# Patient Record
Sex: Male | Born: 1985 | Race: White | Hispanic: No | Marital: Single | Smoking: Current some day smoker
Health system: Southern US, Community
[De-identification: ages and names within clinical notes are randomized; demographics above are authoritative.]

## PROBLEM LIST (undated history)

## (undated) DIAGNOSIS — K200 Eosinophilic esophagitis: Secondary | ICD-10-CM

## (undated) HISTORY — DX: Eosinophilic esophagitis: K20.0

---

## 2010-09-17 ENCOUNTER — Ambulatory Visit: Payer: Self-pay | Admitting: Surgery

## 2013-08-24 ENCOUNTER — Emergency Department: Payer: Self-pay | Admitting: Emergency Medicine

## 2016-05-30 ENCOUNTER — Emergency Department: Admission: EM | Admit: 2016-05-30 | Discharge: 2016-05-30 | Discharge status: Absent without leave | Payer: Self-pay

## 2017-07-11 ENCOUNTER — Encounter: Payer: Self-pay | Admitting: Physician Assistant

## 2017-07-11 ENCOUNTER — Ambulatory Visit: Payer: Self-pay | Admitting: Physician Assistant

## 2017-07-11 VITALS — BP 130/79 | HR 100 | Temp 98.5°F | Resp 16

## 2017-07-11 DIAGNOSIS — H6500 Acute serous otitis media, unspecified ear: Secondary | ICD-10-CM

## 2017-07-11 DIAGNOSIS — J209 Acute bronchitis, unspecified: Secondary | ICD-10-CM

## 2017-07-11 MED ORDER — METHYLPREDNISOLONE 4 MG PO TBPK: ORAL_TABLET | ORAL | 0 refills | Status: DC

## 2017-07-11 MED ORDER — AMOXICILLIN-POT CLAVULANATE 875-125 MG PO TABS: 1.0000 | ORAL_TABLET | Freq: Two times a day (BID) | ORAL | 0 refills | Status: DC

## 2017-07-11 NOTE — Progress Notes (Signed)
S: Patient complains of bilateral ear pain, coughing congestion, throat irritation, denies fever chills, denies chest pain or shortness of breath. States he's had the symptoms for several days. He thought it was just a cold. Now his ears are really hurting. History of multiple ear infections as a child. + smoker,  Dips snuff  O: Vitals are normal, both TMs are red and swollen, throat is irritated, neck is supple, no lymphadenopathy noted, lungs are clear to auscultation with a few scattered wheezes in the lower lung fields bilaterally, heart sounds are normal,  A: Acute bronchitis, acute otitis media  P: Augmentin 875 mg twice a day for 10 days, Medrol Dosepak

## 2018-05-23 ENCOUNTER — Ambulatory Visit: Payer: Self-pay | Admitting: Family Medicine

## 2018-05-23 ENCOUNTER — Other Ambulatory Visit: Payer: Self-pay

## 2018-05-23 ENCOUNTER — Ambulatory Visit (INDEPENDENT_AMBULATORY_CARE_PROVIDER_SITE_OTHER): Payer: Managed Care, Other (non HMO) | Admitting: Family Medicine

## 2018-05-23 ENCOUNTER — Encounter: Payer: Self-pay | Admitting: Family Medicine

## 2018-05-23 VITALS — BP 119/81 | HR 75 | Temp 98.2°F | Ht 71.5 in | Wt 160.0 lb

## 2018-05-23 DIAGNOSIS — M545 Low back pain: Secondary | ICD-10-CM

## 2018-05-23 DIAGNOSIS — J309 Allergic rhinitis, unspecified: Secondary | ICD-10-CM

## 2018-05-23 DIAGNOSIS — Z7689 Persons encountering health services in other specified circumstances: Secondary | ICD-10-CM

## 2018-05-23 DIAGNOSIS — K200 Eosinophilic esophagitis: Secondary | ICD-10-CM

## 2018-05-23 DIAGNOSIS — G8929 Other chronic pain: Secondary | ICD-10-CM

## 2018-05-23 DIAGNOSIS — G4700 Insomnia, unspecified: Secondary | ICD-10-CM

## 2018-05-23 MED ORDER — TRAMADOL HCL 50 MG PO TABS: 50.0000 mg | ORAL_TABLET | Freq: Three times a day (TID) | ORAL | 0 refills | Status: DC | PRN

## 2018-05-23 MED ORDER — MELOXICAM 15 MG PO TABS: 15.0000 mg | ORAL_TABLET | Freq: Every day | ORAL | 0 refills | Status: DC

## 2018-05-23 MED ORDER — CYCLOBENZAPRINE HCL 10 MG PO TABS: 10.0000 mg | ORAL_TABLET | Freq: Three times a day (TID) | ORAL | 1 refills | Status: DC | PRN

## 2018-05-23 NOTE — Progress Notes (Signed)
BP 119/81   Pulse 75   Temp 98.2 F (36.8 C) (Oral)   Ht 5' 11.5" (1.816 m)   Wt 160 lb (72.6 kg)   SpO2 98%   BMI 22.00 kg/m    Subjective:    Patient ID: Joshua Davies, male    DOB: 15-Nov-1985, 32 y.o.   MRN: [id number]  HPI: Joshua Davies is a 32 y.o. male  Chief Complaint  Patient presents with  . New Patient (Initial Visit)    pt states he is having issues with not sleeping well and back pain   Here today to est care.   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 Takes ibuprofen and tylenol daily. Pain radiates across entire back, feels like it locks up on him regularly. Used to go to a Land which worked at first but not after a while. Occasionally down right leg to knee. No incontinence, leg weakness, numbness.   Taking high dose melatonin for sleep, starting to not be effective for him. Feels his back pain is inhibiting his sleep as well.   Takes OTC zyrtec for seasonal allergies with good relief. Taking prilosec OTC for GERD prn.   Relevant past medical, surgical, family and social history reviewed and updated as indicated. Interim medical history since our last visit reviewed. Allergies and medications reviewed and updated.  Review of Systems  Per HPI unless specifically indicated above     Objective:    BP 119/81   Pulse 75   Temp 98.2 F (36.8 C) (Oral)   Ht 5' 11.5" (1.816 m)   Wt 160 lb (72.6 kg)   SpO2 98%   BMI 22.00 kg/m   Wt Readings from Last 3 Encounters:  05/23/18 160 lb (72.6 kg)    Physical Exam  Constitutional: He is oriented to person, place, and time. He appears well-developed and well-nourished. No distress.  HENT:  Head: Atraumatic.  Eyes: Conjunctivae and EOM are normal.  Neck: Normal range of motion. Neck supple.  Cardiovascular: Normal rate, regular rhythm and intact distal pulses.  Pulmonary/Chest: Effort normal and breath sounds normal.  Musculoskeletal: Normal range  of motion. He exhibits no tenderness or deformity.  - SLR  Neurological: He is alert and oriented to person, place, and time. No cranial nerve deficit.  Skin: Skin is warm and dry.  Psychiatric: He has a normal mood and affect. His behavior is normal.  Nursing note and vitals reviewed.   No results found for this or any previous visit.    Assessment & Plan:   Problem List Items Addressed This Visit      Respiratory   Allergic rhinitis    Stable on prn zyrtec, continue current regimen        Digestive   Eosinophilic esophagitis    Stable on prilosec prn        Other   Insomnia    Encouraged good sleep hygiene, unisom prn for inability to sleep. Can continue melatonin       Other Visit Diagnoses    Chronic bilateral low back pain without sciatica    -  Primary   Pt wanting to save workup for next insurance period. Flexeril, meloxicam prn with tramadol small supply for severe pain episodes. Stretches, heating pad   Relevant Medications   cyclobenzaprine (FLEXERIL) 10 MG tablet   traMADol (ULTRAM) 50 MG tablet   meloxicam (MOBIC) 15 MG tablet   Encounter  to establish care           Follow up plan: Return for CPE.

## 2018-05-27 DIAGNOSIS — K200 Eosinophilic esophagitis: Secondary | ICD-10-CM | POA: Insufficient documentation

## 2018-05-27 NOTE — Assessment & Plan Note (Signed)
Encouraged good sleep hygiene, unisom prn for inability to sleep. Can continue melatonin

## 2018-05-27 NOTE — Assessment & Plan Note (Signed)
Stable on prilosec prn

## 2018-05-27 NOTE — Patient Instructions (Signed)
Follow up for CPE 

## 2018-05-27 NOTE — Assessment & Plan Note (Signed)
Stable on prn zyrtec, continue current regimen

## 2018-06-01 ENCOUNTER — Ambulatory Visit (INDEPENDENT_AMBULATORY_CARE_PROVIDER_SITE_OTHER): Payer: Managed Care, Other (non HMO) | Admitting: Family Medicine

## 2018-06-01 ENCOUNTER — Encounter: Payer: Self-pay | Admitting: Family Medicine

## 2018-06-01 VITALS — BP 116/79 | HR 86 | Temp 98.5°F | Ht 71.0 in | Wt 160.3 lb

## 2018-06-01 DIAGNOSIS — Z23 Encounter for immunization: Secondary | ICD-10-CM | POA: Diagnosis not present

## 2018-06-01 NOTE — Patient Instructions (Signed)

## 2018-06-01 NOTE — Progress Notes (Signed)
Pt presented today hoping for CPE, was offered one but ended up declining as he did not want labs drawn today and could not come back for them within 1 week. Will reschedule

## 2018-06-01 NOTE — Progress Notes (Deleted)
   BP 116/79   Pulse 86   Temp 98.5 F (36.9 C) (Oral)   Ht 5\' 11"  (1.803 m)   Wt 160 lb 4.8 oz (72.7 kg)   SpO2 99%   BMI 22.36 kg/m    Subjective:    Patient ID: Joshua Davies, male    DOB: 1986-01-23, 32 y.o.   MRN: [id number]  HPI: Joshua Davies is a 32 y.o. male  Chief Complaint  Patient presents with  . Insomnia    pt states he has been struggling with sleep issues all of his life, states it is mainly trouble going to sleep     Relevant past medical, surgical, family and social history reviewed and updated as indicated. Interim medical history since our last visit reviewed. Allergies and medications reviewed and updated.  Review of Systems  Per HPI unless specifically indicated above     Objective:    BP 116/79   Pulse 86   Temp 98.5 F (36.9 C) (Oral)   Ht 5\' 11"  (1.803 m)   Wt 160 lb 4.8 oz (72.7 kg)   SpO2 99%   BMI 22.36 kg/m   Wt Readings from Last 3 Encounters:  06/01/18 160 lb 4.8 oz (72.7 kg)  05/23/18 160 lb (72.6 kg)    Physical Exam  No results found for this or any previous visit.    Assessment & Plan:   Problem List Items Addressed This Visit    None    Visit Diagnoses    Need for influenza vaccination    -  Primary   Relevant Orders   Flu Vaccine QUAD 36+ mos IM (Completed)       Follow up plan: No follow-ups on file.

## 2018-06-10 ENCOUNTER — Emergency Department
Admission: EM | Admit: 2018-06-10 | Discharge: 2018-06-10 | Disposition: A | Discharge status: Home / Self Care | Payer: No Typology Code available for payment source | Attending: Emergency Medicine | Admitting: Emergency Medicine

## 2018-06-10 ENCOUNTER — Emergency Department: Payer: No Typology Code available for payment source

## 2018-06-10 ENCOUNTER — Other Ambulatory Visit: Payer: Self-pay

## 2018-06-10 DIAGNOSIS — Z79899 Other long term (current) drug therapy: Secondary | ICD-10-CM | POA: Diagnosis not present

## 2018-06-10 DIAGNOSIS — Y990 Civilian activity done for income or pay: Secondary | ICD-10-CM | POA: Diagnosis not present

## 2018-06-10 DIAGNOSIS — Y9389 Activity, other specified: Secondary | ICD-10-CM | POA: Diagnosis not present

## 2018-06-10 DIAGNOSIS — F17220 Nicotine dependence, chewing tobacco, uncomplicated: Secondary | ICD-10-CM | POA: Diagnosis not present

## 2018-06-10 DIAGNOSIS — S63617A Unspecified sprain of left little finger, initial encounter: Secondary | ICD-10-CM

## 2018-06-10 DIAGNOSIS — Y929 Unspecified place or not applicable: Secondary | ICD-10-CM | POA: Diagnosis not present

## 2018-06-10 DIAGNOSIS — S6992XA Unspecified injury of left wrist, hand and finger(s), initial encounter: Secondary | ICD-10-CM | POA: Diagnosis present

## 2018-06-10 DIAGNOSIS — W51XXXA Accidental striking against or bumped into by another person, initial encounter: Secondary | ICD-10-CM | POA: Diagnosis not present

## 2018-06-10 NOTE — ED Notes (Signed)
Pt does  Not need uds per Lt cm martin  Present with the patient
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
## 2018-06-19 ENCOUNTER — Other Ambulatory Visit: Payer: Self-pay | Admitting: Family Medicine

## 2018-06-19 NOTE — Telephone Encounter (Signed)
Please review for refill for Meloxicam 15 mg. No lab ordered for Hgb or Creatinine for this medication, as per protocol. LOV  06/01/18 NOV 06/29/18  Roosvelt Maser, PA [facility] # [phone]  [address] [place]

## 2018-06-20 ENCOUNTER — Other Ambulatory Visit: Payer: Self-pay | Admitting: Family Medicine

## 2018-06-29 ENCOUNTER — Ambulatory Visit: Payer: Managed Care, Other (non HMO) | Admitting: Family Medicine

## 2018-06-29 ENCOUNTER — Encounter: Payer: Managed Care, Other (non HMO) | Admitting: Family Medicine

## 2018-07-13 ENCOUNTER — Other Ambulatory Visit: Payer: Self-pay | Admitting: Family Medicine

## 2018-07-13 NOTE — Telephone Encounter (Signed)
Requested Prescriptions  Pending Prescriptions Disp Refills  . meloxicam (MOBIC) 15 MG tablet [Pharmacy Med Name: MELOXICAM 15 MG TABLET] 30 tablet 0    Sig: TAKE 1 TABLET BY MOUTH EVERY DAY     Analgesics:  COX2 Inhibitors Failed - 07/13/2018  1:58 AM      Failed - HGB in normal range and within 360 days    No results found for: HGB, HGBKUC, HGBPOCKUC       Failed - Cr in normal range and within 360 days    No results found for: CREATININE       Passed - Patient is not pregnant      Passed - Valid encounter within last 12 months    Recent Outpatient Visits          1 month ago Need for influenza vaccination   [facility] Part[facility] N[facility], Rachel Elizabeth, PA-C   1 month ago Chronic bilateral low back pain without sciatica   [facility], [place], [place]      Future Appointments            In 3 weeks Maurice MarchLane, Salley Hewsachel Elizabeth, [facility]-C [facility]

## 2018-08-03 ENCOUNTER — Encounter: Payer: Managed Care, Other (non HMO) | Admitting: Family Medicine

## 2018-08-08 ENCOUNTER — Other Ambulatory Visit: Payer: Self-pay | Admitting: Family Medicine

## 2018-08-08 NOTE — Telephone Encounter (Signed)
Requested medication (s) are due for refill today -yes  Requested medication (s) are on the active medication list -yes  Future visit scheduled -yes  Last refill: Flexeril-05/23/18                  Mobic- 07/13/18   Notes to clinic: Patient is requesting a non delegated medication. No labs have been drawn on patient so lab protocol has failed- sent for review.  Requested Prescriptions  Pending Prescriptions Disp Refills   cyclobenzaprine (FLEXERIL) 10 MG tablet [Pharmacy Med Name: CYCLOBENZAPRINE 10 MG TABLET] 90 tablet 1    Sig: TAKE 1 TABLET BY MOUTH THREE TIMES A DAY AS NEEDED FOR MUSCLE SPASMS     Not Delegated - Analgesics:  Muscle Relaxants Failed - 08/08/2018 11:09 AM      Failed - This refill cannot be delegated      Passed - Valid encounter within last 6 months    Recent Outpatient Visits          2 months ago Need for influenza vaccination   [facility] NearingLane, Rachel Elizabeth, PA-C   2 months ago Chronic bilateral low back pain without sciatica   [facility] Maurice MarchLane, Salley Hewsachel Elizabeth, [place]      Future Appointments            In 1 week Maurice MarchLane, Salley Hewsachel Elizabeth, PA-C [facility]          meloxicam (MOBIC) 15 MG tablet [Pharmacy Med Name: MELOXICAM 15 MG TABLET] 30 tablet 0    Sig: TAKE 1 TABLET BY MOUTH EVERY DAY     Analgesics:  COX2 Inhibitors Failed - 08/08/2018 11:09 AM      Failed - HGB in normal range and within 360 days    No results found for: HGB, HGBKUC, HGBPOCKUC       Failed - Cr in normal range and within 360 days    No results found for: CREATININE       Passed - Patient is not pregnant      Passed - Valid encounter within last 12 months    Recent Outpatient Visits          2 months ago Need for influenza vaccination   [facility], [place], [place]   2 months ago Chronic bilateral low back pain without sciatica   [facility] Maurice MarchLane, Salley Hewsachel Elizabeth, [place]       Future Appointments            In 1 week Maurice MarchLane, Salley Hewsachel Elizabeth, PA-C [facility]            Requested Prescriptions  Pending Prescriptions Disp Refills   cyclobenzaprine (FLEXERIL) 10 MG tablet [Pharmacy Med Name: CYCLOBENZAPRINE 10 MG TABLET] 90 tablet 1    Sig: TAKE 1 TABLET BY MOUTH THREE TIMES A DAY AS NEEDED FOR MUSCLE SPASMS     Not Delegated - Analgesics:  Muscle Relaxants Failed - 08/08/2018 11:09 AM      Failed - This refill cannot be delegated      Passed - Valid encounter within last 6 months    Recent Outpatient Visits          2 months ago Need for influenza vaccination   [facility] Rachel Elizabeth, PA-C   2 months ago Chronic bilateral low back pain without sciatica   [facility] N[facility]Lane, Rachel [place], [place]      Future Appointments  In 1 week Particia Nearing, PA-C [facility]          meloxicam (MOBIC) 15 MG tablet [Pharmacy Med Name: MELOXICAM 15 MG TABLET] 30 tablet 0    Sig: TAKE 1 TABLET BY MOUTH EVERY DAY     Analgesics:  COX2 Inhibitors Failed - 08/08/2018 11:09 AM      Failed - HGB in normal range and within 360 days    No results found for: HGB, HGBKUC, HGBPOCKUC       Failed - Cr in normal range and within 360 days    No results found for: CREATININE       Passed - Patient is not pregnant      Passed - Valid encounter within last 12 months    Recent Outpatient Visits          2 months ago Need for influenza vaccination   [facility] Particia Nearing, [place]   2 months ago Chronic bilateral low back pain without sciatica   [facility], Salley Hews, [place]      Future Appointments            In 1 week Maurice March, Salley Hews, PA-C [facility]

## 2018-08-17 ENCOUNTER — Encounter: Payer: Self-pay | Admitting: Family Medicine

## 2018-08-17 ENCOUNTER — Ambulatory Visit (INDEPENDENT_AMBULATORY_CARE_PROVIDER_SITE_OTHER): Payer: Managed Care, Other (non HMO) | Admitting: Family Medicine

## 2018-08-17 VITALS — BP 131/80 | HR 106 | Temp 97.6°F | Ht 72.5 in | Wt 166.9 lb

## 2018-08-17 DIAGNOSIS — Z0000 Encounter for general adult medical examination without abnormal findings: Secondary | ICD-10-CM

## 2018-08-17 DIAGNOSIS — F5101 Primary insomnia: Secondary | ICD-10-CM | POA: Diagnosis not present

## 2018-08-17 LAB — UA/M W/RFLX CULTURE, ROUTINE
Bilirubin, UA: NEGATIVE
Glucose, UA: NEGATIVE
KETONES UA: NEGATIVE
Leukocytes, UA: NEGATIVE
NITRITE UA: NEGATIVE
Protein, UA: NEGATIVE
RBC UA: NEGATIVE
SPEC GRAV UA: 1.02 (ref 1.005–1.030)
UUROB: 0.2 mg/dL (ref 0.2–1.0)
pH, UA: 6 (ref 5.0–7.5)

## 2018-08-17 MED ORDER — SUVOREXANT 5 MG PO TABS: 5.0000 mg | ORAL_TABLET | Freq: Every evening | ORAL | 0 refills | Status: DC | PRN
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
## 2018-08-17 NOTE — Progress Notes (Signed)
BP 131/80   Pulse (!) 106   Temp 97.6 F (36.4 C) (Oral)   Ht 6' 0.5" (1.842 m)   Wt 166 lb 14.4 oz (75.7 kg)   SpO2 99%   BMI 22.32 kg/m    Subjective:    Patient ID: Joshua Davies, male    DOB: 1986-06-29, 32 y.o.   MRN: [id number]  HPI: Joshua Davies is a 32 y.o. male presenting on 08/17/2018 for comprehensive medical examination. Current medical complaints include:see below  Still struggling with sleep consistency. Has had this issue for years, falls asleep fairly well but wakes up many times in the night and struggles to fall asleep. Has tried zzquil, melatonin, trazodone, benadryl in the past which didn't work.   He currently lives with: Interim Problems from his last visit: yes  Depression Screen done today and results listed below:  [facility]17[facility]2018 05/23/2018  Decreased Interest 0 0  Down, Depressed, Hopeless 0 0  PHQ - 2 Score 0 0  Altered sleeping 0 1  Tired, decreased energy 0 0  Change in appetite 0 0  Feeling bad or failure about yourself  0 0  Trouble concentrating 0 0  Moving slowly or fidgety/restless 0 0  Suicidal thoughts 0 0  PHQ-9 Score 0 1  Difficult doing work/chores Not difficult at all -    The patient does not have a history of falls. I did not complete a risk assessment for falls. A plan of care for falls was not documented.   Past Medical History:  Past Medical History:  Diagnosis Date  . Eosinophilic esophagitis     Surgical History:  History reviewed. No pertinent surgical history.  Medications:  Current Outpatient Medications on File Prior to Visit  Medication Sig  . Cetirizine HCl (ZYRTEC PO) Take by mouth daily.  . cyclobenzaprine (FLEXERIL) 10 MG tablet TAKE 1 TABLET BY MOUTH THREE TIMES A DAY AS NEEDED FOR MUSCLE SPASMS  . meloxicam (MOBIC) 15 MG tablet TAKE 1 TABLET BY MOUTH EVERY DAY  . OMEPRAZOLE PO Take 20 mg by mouth daily as needed.    No current facility-administered medications on file  prior to visit.     Allergies:  Allergies  Allergen Reactions  . Gluten Meal   . Wheat Bran     Social History:  Social History   Socioeconomic History  . Marital status: Single    Spouse name: Not on file  . Number of children: Not on file  . Years of education: Not on file  . Highest education level: Not on file  Occupational History  . Not on file  Social Needs  . Financial resource strain: Not on file  . Food insecurity:    Worry: Not on file    Inability: Not on file  . Transportation needs:    Medical: Not on file    Non-medical: Not on file  Tobacco Use  . Smoking status: Former Games developermoker  . Smokeless tobacco: Current User    Types: Snuff  Substance and Sexual Activity  . Alcohol use: Yes    Alcohol/week: 3.0 standard drinks    Types: 3 Glasses of wine per week  . Drug use: Not Currently  . Sexual activity: Yes  Lifestyle  . Physical activity:    Days per week: Not on file    Minutes per session: Not on file  . Stress: Not on file  Relationships  . Social connections:    Talks on phone:  Not on file    Gets together: Not on file    Attends religious service: Not on file    Active member of club or organization: Not on file    Attends meetings of clubs or organizations: Not on file    Relationship status: Not on file  . Intimate partner violence:    Fear of current or ex partner: Not on file    Emotionally abused: Not on file    Physically abused: Not on file    Forced sexual activity: Not on file  Other Topics Concern  . Not on file  Social History Narrative  . Not on file   Social History   Tobacco Use  Smoking Status Former Smoker  Smokeless Tobacco Current User  . Types: Snuff   Social History   Substance and Sexual Activity  Alcohol Use Yes  . Alcohol/week: 3.0 standard drinks  . Types: 3 Glasses of wine per week    Family History:  Family History  Problem Relation Age of Onset  . Kidney Stones Mother   . Diverticulitis Father     . Hypertension Father   . Hyperlipidemia Father   . Allergies Sister   . Pancreatic cancer Maternal Grandmother   . Lung cancer Maternal Grandfather   . Hypertension Paternal Grandfather     Past medical history, surgical history, medications, allergies, family history and social history reviewed with patient today and changes made to appropriate areas of the chart.   Review of Systems - General ROS: positive for  - sleep disturbance Psychological ROS: negative Ophthalmic ROS: negative ENT ROS: negative Allergy and Immunology ROS: negative Hematological and Lymphatic ROS: negative Endocrine ROS: negative Respiratory ROS: no cough, shortness of breath, or wheezing Cardiovascular ROS: no chest pain or dyspnea on exertion Gastrointestinal ROS: no abdominal pain, change in bowel habits, or black or bloody stools Genito-Urinary ROS: no dysuria, trouble voiding, or hematuria Musculoskeletal ROS: negative Neurological ROS: no TIA or stroke symptoms Dermatological ROS: negative All other ROS negative except what is listed above and in the HPI.      Objective:    BP 131/80   Pulse (!) 106   Temp 97.6 F (36.4 C) (Oral)   Ht 6' 0.5" (1.842 m)   Wt 166 lb 14.4 oz (75.7 kg)   SpO2 99%   BMI 22.32 kg/m   Wt Readings from Last 3 Encounters:  08/17/18 166 lb 14.4 oz (75.7 kg)  06/10/18 165 lb (74.8 kg)  06/01/18 160 lb 4.8 oz (72.7 kg)    Physical Exam Vitals signs and nursing note reviewed.  Constitutional:      General: He is not in acute distress.    Appearance: He is well-developed.  HENT:     Head: Atraumatic.     Right Ear: External ear normal.     Left Ear: External ear normal.     Nose: Nose normal.  Eyes:     General: No scleral icterus.    Conjunctiva/sclera: Conjunctivae normal.     Pupils: Pupils are equal, round, and reactive to light.  Neck:     Musculoskeletal: Normal range of motion and neck supple.  Cardiovascular:     Rate and Rhythm: Normal rate and  regular rhythm.     Heart sounds: Normal heart sounds. No murmur.  Pulmonary:     Effort: Pulmonary effort is normal. No respiratory distress.     Breath sounds: Normal breath sounds.  Abdominal:     General: Bowel sounds  are normal. There is no distension.     Palpations: Abdomen is soft. There is no mass.     Tenderness: There is no abdominal tenderness. There is no guarding.  Musculoskeletal: Normal range of motion.        General: No tenderness.  Skin:    General: Skin is warm and dry.     Findings: No rash.  Neurological:     Mental Status: He is alert and oriented to person, place, and time.     Deep Tendon Reflexes: Reflexes are normal and symmetric.  Psychiatric:        Behavior: Behavior normal.     Results for orders placed or performed in visit on 08/17/18  UA/M w/rflx Culture, Routine  Result Value Ref Range   Specific Gravity, UA 1.020 1.005 - 1.030   pH, UA 6.0 5.0 - 7.5   Color, UA Yellow Yellow   Appearance Ur Clear Clear   Leukocytes, UA Negative Negative   Protein, UA Negative Negative/Trace   Glucose, UA Negative Negative   Ketones, UA Negative Negative   RBC, UA Negative Negative   Bilirubin, UA Negative Negative   Urobilinogen, Ur 0.2 0.2 - 1.0 mg/dL   Nitrite, UA Negative Negative      Assessment & Plan:   Problem List Items Addressed This Visit      Other   Insomnia - Primary    Failed multiple agents in the past, will trial belsomra and monitor closely for benefit. Continue good sleep hygiene.        Other Visit Diagnoses    Annual physical exam       Relevant Orders   CBC with Differential/Platelet   Comprehensive metabolic panel   Lipid Panel w/o Chol/HDL Ratio   TSH   UA/M w/rflx Culture, Routine (Completed)       Discussed aspirin prophylaxis for myocardial infarction prevention and decision was it was not indicated  LABORATORY TESTING:  Health maintenance labs ordered today as discussed above.   The natural history of  prostate cancer and ongoing controversy regarding screening and potential treatment outcomes of prostate cancer has been discussed with the patient. The meaning of a false positive PSA and a false negative PSA has been discussed. He indicates understanding of the limitations of this screening test and wishes not to proceed with screening PSA testing.   IMMUNIZATIONS:   - Tdap: Tetanus vaccination status reviewed: last tetanus booster within 10 years. - Influenza: Up to date  PATIENT COUNSELING:    Sexuality: Discussed sexually transmitted diseases, partner selection, use of condoms, avoidance of unintended pregnancy  and contraceptive alternatives.   Advised to avoid cigarette smoking.  I discussed with the patient that most people either abstain from alcohol or drink within safe limits (<=14/week and <=4 drinks/occasion for males, <=7/weeks and <= 3 drinks/occasion for females) and that the risk for alcohol disorders and other health effects rises proportionally with the number of drinks per week and how often a drinker exceeds daily limits.  Discussed cessation/primary prevention of drug use and availability of treatment for abuse.   Diet: Encouraged to adjust caloric intake to maintain  or achieve ideal body weight, to reduce intake of dietary saturated fat and total fat, to limit sodium intake by avoiding high sodium foods and not adding table salt, and to maintain adequate dietary potassium and calcium preferably from fresh fruits, vegetables, and low-fat dairy products.    stressed the importance of regular exercise  Injury prevention: Discussed safety  belts, safety helmets, smoke detector, smoking near bedding or upholstery.   Dental health: Discussed importance of regular tooth brushing, flossing, and dental visits.   Follow up plan: NEXT PREVENTATIVE PHYSICAL DUE IN 1 YEAR. Return in about 4 weeks (around 09/14/2018) for Sleep f/u.

## 2018-08-18 LAB — COMPREHENSIVE METABOLIC PANEL
ALBUMIN: 4.9 g/dL (ref 3.5–5.5)
ALK PHOS: 99 IU/L (ref 39–117)
ALT: 58 IU/L — ABNORMAL HIGH (ref 0–44)
AST: 25 IU/L (ref 0–40)
Albumin/Globulin Ratio: 2.1 (ref 1.2–2.2)
BILIRUBIN TOTAL: 0.5 mg/dL (ref 0.0–1.2)
BUN/Creatinine Ratio: 17 (ref 9–20)
BUN: 18 mg/dL (ref 6–20)
CHLORIDE: 99 mmol/L (ref 96–106)
CO2: 22 mmol/L (ref 20–29)
CREATININE: 1.09 mg/dL (ref 0.76–1.27)
Calcium: 10 mg/dL (ref 8.7–10.2)
GFR calc Af Amer: 105 mL/min/{1.73_m2} (ref >59)
GFR calc non Af Amer: 91 mL/min/{1.73_m2} (ref >59)
Globulin, Total: 2.3 g/dL (ref 1.5–4.5)
Glucose: 69 mg/dL (ref 65–99)
Potassium: 4.2 mmol/L (ref 3.5–5.2)
SODIUM: 138 mmol/L (ref 134–144)
Total Protein: 7.2 g/dL (ref 6.0–8.5)

## 2018-08-18 LAB — LIPID PANEL W/O CHOL/HDL RATIO
CHOLESTEROL TOTAL: 173 mg/dL (ref 100–199)
HDL: 39 mg/dL — ABNORMAL LOW (ref >39)
LDL Calculated: 112 mg/dL — ABNORMAL HIGH (ref 0–99)
TRIGLYCERIDES: 110 mg/dL (ref 0–149)
VLDL CHOLESTEROL CAL: 22 mg/dL (ref 5–40)

## 2018-08-18 LAB — CBC WITH DIFFERENTIAL/PLATELET
BASOS ABS: 0 10*3/uL (ref 0.0–0.2)
Basos: 1 %
EOS (ABSOLUTE): 0.4 10*3/uL (ref 0.0–0.4)
Eos: 7 %
Hematocrit: 46.4 % (ref 37.5–51.0)
Hemoglobin: 15.9 g/dL (ref 13.0–17.7)
Immature Grans (Abs): 0 10*3/uL (ref 0.0–0.1)
Immature Granulocytes: 0 %
LYMPHS ABS: 2.4 10*3/uL (ref 0.7–3.1)
Lymphs: 40 %
MCH: 29.3 pg (ref 26.6–33.0)
MCHC: 34.3 g/dL (ref 31.5–35.7)
MCV: 86 fL (ref 79–97)
MONOS ABS: 0.4 10*3/uL (ref 0.1–0.9)
Monocytes: 7 %
NEUTROS ABS: 2.8 10*3/uL (ref 1.4–7.0)
Neutrophils: 45 %
Platelets: 363 10*3/uL (ref 150–450)
RBC: 5.42 x10E6/uL (ref 4.14–5.80)
RDW: 12.7 % (ref 12.3–15.4)
WBC: 6.1 10*3/uL (ref 3.4–10.8)

## 2018-08-18 LAB — TSH: TSH: 0.799 u[IU]/mL (ref 0.450–4.500)

## 2018-08-20 ENCOUNTER — Telehealth: Payer: Self-pay | Admitting: Family Medicine

## 2018-08-20 NOTE — Telephone Encounter (Signed)
Patient insurance not updated in system yet, need insurance information before proceeding. Will wait for insurance, fax or e-mail from covermymeds.

## 2018-08-20 NOTE — Telephone Encounter (Signed)
Copied from CRM [phone]. Topic: General - Other >> Aug 20, 2018  3:38 PM Raoul PitchWilliams-Neal, Barron AlvineSade R wrote: Patients wife called in and stated a PA os needed for Suvorexant (BELSOMRA) 5 MG TABS

## 2018-08-23 NOTE — Telephone Encounter (Signed)
Prior authorization for Belsomra was initiated via [email or web address]. KeyMarland Kitchen: [id number]   VWUJW [phone]   [employer] [id number]

## 2018-08-24 NOTE — Telephone Encounter (Signed)
Pt is calling to find out when the PA will be done so he can get this medication.

## 2018-08-24 NOTE — Telephone Encounter (Signed)
Spoke with patient explained waiting to hear regarding PA

## 2018-08-24 NOTE — Telephone Encounter (Signed)
Searched PA on Cover My Meds and Belsomra was denied. Routing to provider to advise.

## 2018-08-27 MED ORDER — ZOLPIDEM TARTRATE 5 MG PO TABS: 5.0000 mg | ORAL_TABLET | Freq: Every evening | ORAL | 0 refills | Status: DC | PRN

## 2018-08-27 NOTE — Addendum Note (Signed)
Addended by: Roosvelt MaserLANE, Ronn Smolinsky E on: 08/27/2018 04:13 PM   Modules accepted: Orders

## 2018-08-27 NOTE — Telephone Encounter (Signed)
Discussed with patient denial and that insurance wants [place]. Will try ambien prn and re-evaluate from there

## 2018-09-06 ENCOUNTER — Other Ambulatory Visit: Payer: Self-pay | Admitting: Family Medicine

## 2018-09-06 NOTE — Telephone Encounter (Signed)
Requested Prescriptions  Pending Prescriptions Disp Refills  . meloxicam (MOBIC) 15 MG tablet [Pharmacy Med Name: MELOXICAM 15 MG TABLET] 30 tablet 0    Sig: TAKE 1 TABLET BY MOUTH EVERY DAY     Analgesics:  COX2 Inhibitors Passed - 09/06/2018  2:22 AM      Passed - HGB in normal range and within 360 days    Hemoglobin  Date Value Ref Range Status  08/17/2018 15.9 13.0 - 17.7 g/dL Final         Passed - Cr in normal range and within 360 days    Creatinine, Ser  Date Value Ref Range Status  08/17/2018 1.09 0.76 - 1.27 mg/dL Final         Passed - Patient is not pregnant      Passed - Valid encounter within last 12 months    Recent Outpatient Visits          2 weeks ago Primary insomnia   [facility] Roosvelt Maser [place], [place]   3 months ago Need for influenza vaccination   [facility] Particia [place], [place]   3 months ago Chronic bilateral low back pain without sciatica   [facility] Roosvelt Maser [place], [place]

## 2018-09-29 ENCOUNTER — Other Ambulatory Visit: Payer: Self-pay | Admitting: Family Medicine

## 2018-10-01 NOTE — Telephone Encounter (Signed)
Requested Prescriptions  Pending Prescriptions Disp Refills  . meloxicam (MOBIC) 15 MG tablet [Pharmacy Med Name: MELOXICAM 15 MG TABLET] 30 tablet 0    Sig: TAKE 1 TABLET BY MOUTH EVERY DAY     Analgesics:  COX2 Inhibitors Passed - 09/29/2018 10:27 AM      Passed - HGB in normal range and within 360 days    Hemoglobin  Date Value Ref Range Status  08/17/2018 15.9 13.0 - 17.7 g/dL Final         Passed - Cr in normal range and within 360 days    Creatinine, Ser  Date Value Ref Range Status  08/17/2018 1.09 0.76 - 1.27 mg/dL Final         Passed - Patient is not pregnant      Passed - Valid encounter within last 12 months    Recent Outpatient Visits          1 month ago Primary insomnia   [facility] Roosvelt Maser [place], [place]   4 months ago Need for influenza vaccination   [facility] Roosvelt Maser [place], [place]   4 months ago Chronic bilateral low back pain without sciatica   [facility] Roosvelt Maser [place], [place]

## 2018-10-02 ENCOUNTER — Other Ambulatory Visit: Payer: Self-pay | Admitting: Family Medicine

## 2018-10-02 NOTE — Telephone Encounter (Signed)
Requested medication (s) are due for refill today: Yes  Requested medication (s) are on the active medication list: Yes  Last refill:  08/08/18  Future visit scheduled: No  Notes to clinic:  See rquest    Requested Prescriptions  Pending Prescriptions Disp Refills   cyclobenzaprine (FLEXERIL) 10 MG tablet [Pharmacy Med Name: CYCLOBENZAPRINE 10 MG TABLET] 90 tablet 1    Sig: TAKE 1 TABLET BY MOUTH THREE TIMES A DAY AS NEEDED FOR MUSCLE SPASMS     Not Delegated - Analgesics:  Muscle Relaxants Failed - 10/02/2018 12:52 PM      Failed - This refill cannot be delegated      Passed - Valid encounter within last 6 months    Recent Outpatient Visits          1 month ago Primary insomnia   [facility] Particia [place], [place]   4 months ago Need for influenza vaccination   [facility] Particia [place], [place]   4 months ago Chronic bilateral low back pain without sciatica   [facility] Roosvelt Maser [place], [place]

## 2018-12-10 ENCOUNTER — Other Ambulatory Visit: Payer: Self-pay | Admitting: Family Medicine

## 2018-12-10 NOTE — Telephone Encounter (Signed)
Requested medication (s) are due for refill today: Yes  Requested medication (s) are on the active medication list: Yes  Last refill:  09/2018  Future visit scheduled: No  Notes to clinic:  Unable to refill per protocol, cannot delegate.     Requested Prescriptions  Pending Prescriptions Disp Refills   cyclobenzaprine (FLEXERIL) 10 MG tablet [Pharmacy Med Name: CYCLOBENZAPRINE 10 MG TABLET] 90 tablet 1    Sig: TAKE 1 TABLET BY MOUTH THREE TIMES A DAY AS NEEDED FOR MUSCLE SPASMS     Not Delegated - Analgesics:  Muscle Relaxants Failed - 12/10/2018  1:11 PM      Failed - This refill cannot be delegated      Passed - Valid encounter within last 6 months    Recent Outpatient Visits          3 months ago Primary insomnia   [facility] Particia [place], [place]   6 months ago Need for influenza vaccination   [facility] Particia [place], [place]   6 months ago Chronic bilateral low back pain without sciatica   [facility] Roosvelt Maser [place], [place]

## 2019-01-16 ENCOUNTER — Other Ambulatory Visit: Payer: Self-pay | Admitting: Family Medicine

## 2019-01-16 NOTE — Telephone Encounter (Signed)
Requested medication (s) are due for refill today: yes  Requested medication (s) are on the active medication list:yes  Last refill: 11/30/2018  Future visit scheduled: No  Notes to clinic:  Medication not delegated    Requested Prescriptions  Pending Prescriptions Disp Refills   cyclobenzaprine (FLEXERIL) 10 MG tablet [Pharmacy Med Name: CYCLOBENZAPRINE 10 MG TABLET] 90 tablet 0    Sig: TAKE 1 TABLET BY MOUTH THREE TIMES A DAY AS NEEDED FOR MUSCLE SPASMS     Not Delegated - Analgesics:  Muscle Relaxants Failed - 01/16/2019 10:32 AM      Failed - This refill cannot be delegated      Passed - Valid encounter within last 6 months    Recent Outpatient Visits          5 months ago Primary insomnia   [facility] Particia [place], [place]   7 months ago Need for influenza vaccination   [facility] Particia [place], [place]   7 months ago Chronic bilateral low back pain without sciatica   [facility] Roosvelt Maser [place], [place]

## 2019-03-09 ENCOUNTER — Other Ambulatory Visit: Payer: Self-pay | Admitting: Family Medicine

## 2019-03-10 NOTE — Telephone Encounter (Signed)
Requested medication (s) are due for refill today: yes  Requested medication (s) are on the active medication list: yes  Last refill:  01/16/19  Future visit scheduled: no  Notes to clinic:  Medication not delegated to NT to refill; pt overdue OV   Requested Prescriptions  Pending Prescriptions Disp Refills   cyclobenzaprine (FLEXERIL) 10 MG tablet [Pharmacy Med Name: CYCLOBENZAPRINE 10 MG TABLET] 90 tablet 0    Sig: TAKE 1 TABLET BY MOUTH THREE TIMES A DAY AS NEEDED FOR MUSCLE SPASMS     Not Delegated - Analgesics:  Muscle Relaxants Failed - 03/09/2019 11:36 AM      Failed - This refill cannot be delegated      Failed - Valid encounter within last 6 months    Recent Outpatient Visits          6 months ago Primary insomnia   [facility] [place], [place]   9 months ago Need for influenza vaccination   [place], [place]   9 months ago Chronic bilateral low back pain without sciatica   [facility] Merrie Roof [place], [place]

## 2019-04-18 ENCOUNTER — Other Ambulatory Visit: Payer: Self-pay | Admitting: Family Medicine

## 2019-04-18 NOTE — Telephone Encounter (Signed)
This refill cannot be delegated   Requested Prescriptions  Pending Prescriptions Disp Refills  . cyclobenzaprine (FLEXERIL) 10 MG tablet [Pharmacy Med Name: CYCLOBENZAPRINE 10 MG TABLET] 90 tablet 0    Sig: TAKE 1 TABLET BY MOUTH THREE TIMES A DAY AS NEEDED FOR MUSCLE SPASMS     Not Delegated - Analgesics:  Muscle Relaxants Failed - 04/18/2019 10:17 AM      Failed - This refill cannot be delegated      Failed - Valid encounter within last 6 months    Recent Outpatient Visits          8 months ago Primary insomnia   [facility] [place], [place]   10 months ago Need for influenza vaccination   [place], [place]   11 months ago Chronic bilateral low back pain without sciatica   [facility] Merrie Roof [place], [place]

## 2019-08-16 ENCOUNTER — Ambulatory Visit
Admission: EM | Admit: 2019-08-16 | Discharge: 2019-08-16 | Disposition: A | Discharge status: Home / Self Care | Payer: Managed Care, Other (non HMO) | Attending: Family Medicine | Admitting: Family Medicine

## 2019-08-16 ENCOUNTER — Other Ambulatory Visit: Payer: Self-pay

## 2019-08-16 ENCOUNTER — Encounter: Payer: Self-pay | Admitting: Emergency Medicine

## 2019-08-16 DIAGNOSIS — Z20828 Contact with and (suspected) exposure to other viral communicable diseases: Secondary | ICD-10-CM | POA: Diagnosis not present

## 2019-08-16 DIAGNOSIS — J029 Acute pharyngitis, unspecified: Secondary | ICD-10-CM

## 2019-08-16 DIAGNOSIS — Z7189 Other specified counseling: Secondary | ICD-10-CM

## 2019-08-16 LAB — RAPID STREP SCREEN (MED CTR MEBANE ONLY): Streptococcus, Group A Screen (Direct): NEGATIVE

## 2019-08-16 NOTE — Discharge Instructions (Signed)
Tylenol or ibuprofen as needed. Rest. Drink plenty of fluids.   Follow up with your primary care physician this week as needed. Return to Urgent care for new or worsening concerns.

## 2019-08-16 NOTE — ED Triage Notes (Signed)
Patient c/o sore throat that started early this morning.  Patient denies fevers.  

## 2019-08-16 NOTE — ED Provider Notes (Signed)
[facility] ____________________________________________  Time seen: Approximately 2:48 PM  I have reviewed the triage vital signs and the nursing notes.   HISTORY  Chief Complaint Sore Throat   HPI Joshua Davies is a 33 y.o. male presenting for evaluation of sore throat.  States he woke up with sore throat this morning.  States sore throat is currently very minimal, did take ibuprofen and Tylenol earlier which helped.  States his wife also has similar symptoms at home.  Denies any known sick contacts.  Denies cough, chest pain or shortness of breath, fevers, change in taste or smell, vomiting or diarrhea.  Some mild nasal congestion which she states this is his normal.  Denies other aggravating alleviating factors.   Past Medical History:  Diagnosis Date  . Eosinophilic esophagitis     Patient Active Problem List   Diagnosis Date Noted  . Eosinophilic esophagitis 05/27/2018  . Allergic rhinitis 05/23/2018  . Insomnia 05/23/2018    History reviewed. No pertinent surgical history.   No current facility-administered medications for this encounter.  Current Outpatient Medications:  .  Cetirizine HCl (ZYRTEC PO), Take by mouth daily., Disp: , Rfl:  .  cyclobenzaprine (FLEXERIL) 10 MG tablet, TAKE 1 TABLET BY MOUTH THREE TIMES A DAY AS NEEDED FOR MUSCLE SPASMS, Disp: 90 tablet, Rfl: 0 .  meloxicam (MOBIC) 15 MG tablet, TAKE 1 TABLET BY MOUTH EVERY DAY, Disp: 30 tablet, Rfl: 0 .  OMEPRAZOLE PO, Take 20 mg by mouth daily as needed. , Disp: , Rfl:   Allergies Gluten meal and Wheat bran  Family History  Problem Relation Age of Onset  . Kidney Stones Mother   . Diverticulitis Father   . Hypertension Father   . Hyperlipidemia Father   . Allergies Sister   . Pancreatic cancer Maternal Grandmother   . Lung cancer Maternal Grandfather   . Hypertension Paternal Grandfather     Social History Social History   Tobacco Use  . Smoking status: Current Some Day  Smoker    Types: Cigarettes  . Smokeless tobacco: Current User    Types: Snuff  Substance Use Topics  . Alcohol use: Yes    Alcohol/week: 3.0 standard drinks    Types: 3 Glasses of wine per week  . Drug use: Not Currently    Review of Systems Constitutional: No fever ENT: Positive sore throat Cardiovascular: Denies chest pain. Respiratory: Denies shortness of breath. Gastrointestinal: No abdominal pain.  No nausea, no vomiting.  No diarrhea.   Musculoskeletal: Negative for back pain. Skin: Negative for rash.   ____________________________________________   PHYSICAL EXAM:  VITAL SIGNS: ED Triage Vitals  Enc Vitals Group     BP 08/16/19 1232 134/88     Pulse --      Resp 08/16/19 1232 16     Temp 08/16/19 1232 98.3 F (36.8 C)     Temp Source 08/16/19 1232 Oral     SpO2 08/16/19 1232 100 %     Weight 08/16/19 1229 165 lb (74.8 kg)     Height 08/16/19 1229 6\' 1"  (1.854 m)     Head Circumference --      Peak Flow --      Pain Score 08/16/19 1229 0     Pain Loc --      Pain Edu? --      Excl. in GC? --    Constitutional: Alert and oriented. Well appearing and in no acute distress. Eyes: Conjunctivae are normal.  Head: Atraumatic. No  sinus tenderness to palpation. No swelling. No erythema.  Ears: no erythema, normal TMs bilaterally.   Nose:No nasal congestion  Mouth/Throat: Mucous membranes are moist. No pharyngeal erythema. No tonsillar swelling or exudate.  Neck: No stridor.  No cervical spine tenderness to palpation. Hematological/Lymphatic/Immunilogical: No cervical lymphadenopathy. Cardiovascular: Normal rate, regular rhythm. Grossly normal heart sounds.  Good peripheral circulation. Respiratory: Normal respiratory effort.  No retractions. No wheezes, rales or rhonchi. Good air movement.  Gastrointestinal: Soft and nontender.  Musculoskeletal: Ambulatory with steady gait. Neurologic:  Normal speech and language. No gait instability. Skin:  Skin appears warm,  dry and intact. No rash noted. Psychiatric: Mood and affect are normal. Speech and behavior are normal.  ___________________________________________   LABS (all labs ordered are listed, but only abnormal results are displayed)  Labs Reviewed  RAPID STREP SCREEN ([facility] ONLY)  CULTURE, GROUP A STREP ([place])  NOVEL CORONAVIRUS, NAA (HOSP ORDER, SEND-OUT TO REF LAB; TAT 18-24 HRS)     PROCEDURES   INITIAL IMPRESSION / ASSESSMENT AND PLAN / ED COURSE  Pertinent labs & imaging results that were available during my care of the patient were reviewed by me and considered in my medical decision making (see chart for details).  Well-appearing patient.  Strep negative, will culture.  COVID-19 testing completed and advice given.  Suspect viral.  Encourage rest, fluids, supportive care, monitoring.  Discussed follow up with Primary care physician this week as needed. Discussed follow up and return parameters including no resolution or any worsening concerns. Patient verbalized understanding and agreed to plan.   ____________________________________________   FINAL CLINICAL IMPRESSION(S) / ED DIAGNOSES  Final diagnoses:  Pharyngitis, unspecified etiology  Advice given about COVID-19 virus infection     ED Discharge Orders    None       Note: This dictation was prepared with Dragon dictation along with smaller phrase technology. Any transcriptional errors that result from this process are unintentional.         Marylene Land, NP 08/16/19 1451

## 2019-08-17 LAB — NOVEL CORONAVIRUS, NAA (HOSP ORDER, SEND-OUT TO REF LAB; TAT 18-24 HRS): SARS-CoV-2, NAA: NOT DETECTED

## 2019-08-18 LAB — CULTURE, GROUP A STREP (THRC)

## 2019-08-19 ENCOUNTER — Other Ambulatory Visit: Payer: Self-pay | Admitting: Internal Medicine

## 2019-08-19 ENCOUNTER — Other Ambulatory Visit: Payer: Self-pay

## 2019-08-19 ENCOUNTER — Ambulatory Visit
Admission: EM | Admit: 2019-08-19 | Discharge: 2019-08-19 | Disposition: A | Discharge status: Home / Self Care | Payer: Managed Care, Other (non HMO) | Attending: Family Medicine | Admitting: Family Medicine

## 2019-08-19 DIAGNOSIS — J029 Acute pharyngitis, unspecified: Secondary | ICD-10-CM | POA: Diagnosis not present

## 2019-08-19 DIAGNOSIS — K122 Cellulitis and abscess of mouth: Secondary | ICD-10-CM

## 2019-08-19 LAB — KOH PREP: Special Requests: NORMAL

## 2019-08-19 LAB — RAPID STREP SCREEN (MED CTR MEBANE ONLY): Streptococcus, Group A Screen (Direct): NEGATIVE

## 2019-08-19 MED ORDER — PENICILLIN V POTASSIUM 500 MG PO TABS: 500.0000 mg | ORAL_TABLET | Freq: Four times a day (QID) | ORAL | 0 refills | Status: AC

## 2019-08-19 MED ORDER — NYSTATIN 100000 UNIT/ML MT SUSP: 5.0000 mL | Freq: Four times a day (QID) | OROMUCOSAL | 0 refills | Status: DC

## 2019-08-19 NOTE — ED Provider Notes (Signed)
[facility]    CSN: [id number] Arrival date & time: 08/19/19  0915      History   Chief Complaint Chief Complaint  Patient presents with  . Sore Throat    HPI Joshua Davies is a 33 y.o. male. who present with unresolved ST since seen here 12/18 at which time his rapid strep was negative. I reviewed the throat culture and was also negative. He is requesting repeat strep test because he feels it was too early to show results. States on the 18th his throat only felt scratchy, this time since yesterday is painful and feels like when he has had strep in the past. Is able to swallow, though is painful. Admits of having post nasal drainage of green color. ST is present all day long, woke up at 1 am with pain and the Tylenol and Ibuprofen had wore off. Admits of having swollen glands. Has been fatigued. Denies abdominal pain. Denies cough, or sweats. Had fever of 101, and felt it last night due to having " cold sweats".      Past Medical History:  Diagnosis Date  . Eosinophilic esophagitis     Patient Active Problem List   Diagnosis Date Noted  . Eosinophilic esophagitis 02/19/7627  . Allergic rhinitis 05/23/2018  . Insomnia 05/23/2018    History reviewed. No pertinent surgical history.     Home Medications    Prior to Admission medications   Medication Sig Start Date End Date Taking? Authorizing Provider  Cetirizine HCl (ZYRTEC PO) Take by mouth daily.    [provider]  cyclobenzaprine (FLEXERIL) 10 MG tablet TAKE 1 TABLET BY MOUTH THREE TIMES A DAY AS NEEDED FOR MUSCLE SPASMS 03/11/19   Volney American, PA-C  meloxicam [facility]) 15 MG tablet TAKE 1 TABLET BY MOUTH EVERY DAY 10/01/18   Volney American, PA-C  OMEPRAZOLE PO Take 20 mg by mouth daily as needed.     [provider]  zolpidem (AMBIEN) 5 MG tablet Take 1 tablet (5 mg total) by mouth at bedtime as needed for sleep. 08/27/18 08/16/19  Volney American, PA-C     Family History Family History  Problem Relation Age of Onset  . Kidney Stones Mother   . Diverticulitis Father   . Hypertension Father   . Hyperlipidemia Father   . Allergies Sister   . Pancreatic cancer Maternal Grandmother   . Lung cancer Maternal Grandfather   . Hypertension Paternal Grandfather     Social History Social History   Tobacco Use  . Smoking status: Current Some Day Smoker    Types: Cigarettes  . Smokeless tobacco: Current User    Types: Snuff  Substance Use Topics  . Alcohol use: Yes    Alcohol/week: 3.0 standard drinks    Types: 3 Glasses of wine per week  . Drug use: Not Currently     Allergies   Gluten meal and Wheat bran   Review of Systems Review of Systems + ST, swollen gland, posterior neck aching, felt he had a fever last night but did not take it, had a fever a few days ago of 101. Denies body aches, cough, SOB, body aches. Plans to return back to work.   Physical Exam Triage Vital Signs ED Triage Vitals  Enc Vitals Group     BP 08/19/19 0928 (!) 129/93     Pulse Rate 08/19/19 0928 79     Resp 08/19/19 0928 17     Temp 08/19/19 0928 98.1  F (36.7 C)     Temp Source 08/19/19 0928 Oral     SpO2 08/19/19 0928 95 %     Weight 08/19/19 0927 165 lb (74.8 kg)     Height --      Head Circumference --      Peak Flow --      Pain Score 08/19/19 0927 7     Pain Loc --      Pain Edu? --      Excl. in GC? --    No data found.  Updated Vital Signs BP (!) 129/93 (BP Location: Right Arm)   Pulse 79   Temp 98.1 F (36.7 C) (Oral)   Resp 17   Wt 165 lb (74.8 kg)   SpO2 95%   BMI 21.77 kg/m   Visual Acuity Right Eye Distance:   Left Eye Distance:   Bilateral Distance:    Right Eye Near:   Left Eye Near:    Bilateral Near:     Physical Exam Constitutional:      General: He is not in acute distress.    Appearance: He is not toxic-appearing.  HENT:     Head: Atraumatic.     Right Ear: Tympanic membrane and ear canal normal.      Left Ear: Tympanic membrane and ear canal normal.     Nose: No congestion or rhinorrhea.     Mouth/Throat:     Mouth: Mucous membranes are moist.     Pharynx: Uvula midline. Posterior oropharyngeal erythema and uvula swelling present.     Tonsils: No tonsillar exudate or tonsillar abscesses.     Comments: Uvula is erythematous and has 3 white patches which were removable and I collected for KOH.  Eyes:     Conjunctiva/sclera: Conjunctivae normal.  Neck:     Comments: On upper anterior chain Cardiovascular:     Rate and Rhythm: Normal rate and regular rhythm.     Heart sounds: No murmur.  Pulmonary:     Effort: Pulmonary effort is normal.     Breath sounds: Normal breath sounds.  Musculoskeletal:     Cervical back: Neck supple.  Lymphadenopathy:     Cervical: Cervical adenopathy present.  Skin:    General: Skin is warm and dry.  Neurological:     Mental Status: He is alert and oriented to person, place, and time.  Psychiatric:        Mood and Affect: Mood normal.        Behavior: Behavior normal.      UC Treatments / Results  Labs (all labs ordered are listed, but only abnormal results are displayed) Labs Reviewed  RAPID STREP SCREEN ([facility] ONLY)    EKG   Radiology No results found.  Procedures Procedures (including critical care time)  Medications Ordered in UC Medications - No data to display  Initial Impression / Assessment and Plan / UC Course  I have reviewed the triage vital signs and the nursing notes. Pertinent labs results that were available during my care of the patient were reviewed by me and considered in my medical decision making (see chart for details). I sent KOH prep of white patches and we will inform him of the results when back. He was educated about if positive, needs to be checked for DM and HIV with PCP, since this is rare in healthy adults.  I went ahead and placed him on PNC 500 mg bid x 10 days.  Final Clinical  Impressions(s) / UC Diagnoses   Final diagnoses:  None   Discharge Instructions   None    ED Prescriptions    None     PDMP not reviewed this encounter.   Garey Ham, PA-C 08/19/19 1016

## 2019-08-19 NOTE — ED Triage Notes (Signed)
Pt. Was seen here on 12/18 with a sore throat, he is back with a sore throat, states he was tested to early for strep. Wants strep testing but NO Covid testing.

## 2019-08-19 NOTE — Discharge Instructions (Signed)
It looks like you have thrush on your throat and if the test comes back positive for this, I will sent a antifungal gargle. The antibiotic can make the yeast worse. There will be a culture which will confirm if there is any bacterial or not.

## 2019-08-19 NOTE — Progress Notes (Signed)
rx sent for Nystatin gargle due to + yeast on KOH at urgent care.

## 2019-08-20 ENCOUNTER — Telehealth: Payer: Self-pay | Admitting: Emergency Medicine

## 2019-08-20 NOTE — Telephone Encounter (Signed)
Contacted pt to inform him of provider note and medication at pharmacy. Pt verbalized understanding, had all questions answered.

## 2019-08-21 LAB — CULTURE, GROUP A STREP (THRC)

## 2019-12-25 ENCOUNTER — Ambulatory Visit (INDEPENDENT_AMBULATORY_CARE_PROVIDER_SITE_OTHER): Payer: Managed Care, Other (non HMO) | Admitting: Family Medicine

## 2019-12-25 ENCOUNTER — Encounter: Payer: Self-pay | Admitting: Family Medicine

## 2019-12-25 DIAGNOSIS — J309 Allergic rhinitis, unspecified: Secondary | ICD-10-CM | POA: Diagnosis not present

## 2019-12-25 MED ORDER — FLUTICASONE PROPIONATE 50 MCG/ACT NA SUSP: 1.0000 | Freq: Two times a day (BID) | NASAL | 6 refills | Status: DC

## 2019-12-25 MED ORDER — PREDNISONE 50 MG PO TABS: ORAL_TABLET | ORAL | 0 refills | Status: DC

## 2019-12-25 MED ORDER — MONTELUKAST SODIUM 10 MG PO TABS: 10.0000 mg | ORAL_TABLET | Freq: Every day | ORAL | 3 refills | Status: DC

## 2019-12-25 NOTE — Progress Notes (Signed)
There were no vitals taken for this visit.   Subjective:    Patient ID: Joshua Davies, male    DOB: [birth year], 34 y.o.   MRN: [id number]  HPI: Joshua Davies is a 34 y.o. male  Chief Complaint  Patient presents with  . Nasal Congestion  . sneezing  . itchy eyes    . This visit was completed via telephone due to the restrictions of the COVID-19 pandemic. All issues as above were discussed and addressed. Physical exam was done as above through visual confirmation on telephone. If it was felt that the patient should be evaluated in the office, they were directed there. The patient verbally consented to this visit. . Location of the patient: home . Location of the provider: work . Those involved with this call:  . Provider: Merrie Roof, PA-C . CMA: Lesle Chris, [place] . Front Desk/Registration: Jill Side  . Time spent on call: 15 minutes on the phone discussing health concerns. 5 minutes total spent in review of patient's record and preparation of their chart. I verified patient identity using two factors (patient name and date of birth). Patient consents verbally to being seen via telemedicine visit today.   Allergies significantly bothersome the past 6 weeks now. Puffy eyes, rhinorrhea, sinus pressure, ear pressure, scratchy throat. Taking OTC allegra and a decongestant in addition to afrin nasal spray without relief. Denies fever, chills, cough, CP, SOB, sick contacts.   Relevant past medical, surgical, family and social history reviewed and updated as indicated. Interim medical history since our last visit reviewed. Allergies and medications reviewed and updated.  Review of Systems  Per HPI unless specifically indicated above     Objective:    There were no vitals taken for this visit.  Wt Readings from Last 3 Encounters:  08/19/19 165 lb (74.8 kg)  08/16/19 165 lb (74.8 kg)  08/17/18 166 lb 14.4 oz (75.7 kg)    Physical Exam  Unable to perform PE due to  patient lack of access to video technology for visit  Results for orders placed or performed during the hospital encounter of 08/19/19  Rapid Strep Screen ([facility] ONLY)   Specimen: Other  Result Value Ref Range   Streptococcus, Group A Screen (Direct) NEGATIVE NEGATIVE  Culture, group A strep   Specimen: Throat  Result Value Ref Range   Specimen Description      THROAT Performed at [facility] [place], [address] [place], [place] [address]    Special Requests      NONE Reflexed from [id number] Performed at [facility], [address], [place], [place] [address]    Culture      NO GROUP A STREP (S.PYOGENES) ISOLATED Performed at [facility], [place] [address] [place], [place] [address]    Report Status 08/21/2019 FINAL   KOH prep   Specimen: Throat  Result Value Ref Range   Specimen Description      THROAT Performed at [facility], [address] [place], [place] [address]    Special Requests      Normal Performed at [facility] [place], [address], [place], [place] [address]    KOH Prep      BUDDING YEAST SEEN Performed at [facility], [address] [place], [place] [address]    Report Status 08/19/2019 FINAL       Assessment & Plan:   Problem List Items Addressed This Visit  Respiratory   Allergic rhinitis - Primary    Not under good control. Continue antihistamine, add singulair and flonase. Discussed risks of afrin use, will start quick prednisone burst to get sxs under control and also help wean off afrin. F/u if sxs worsening or not improving          Follow up plan: Return if symptoms worsen or fail to improve.

## 2019-12-26 NOTE — Assessment & Plan Note (Signed)
Not under good control. Continue antihistamine, add singulair and flonase. Discussed risks of afrin use, will start quick prednisone burst to get sxs under control and also help wean off afrin. F/u if sxs worsening or not improving

## 2020-03-27 IMAGING — DX DG FINGER LITTLE 2+V*L*
3 series · 3 of 3 positions shown · non-contrast
Comparison: None

CLINICAL DATA: Pain.  Injury.

EXAM:
LEFT LITTLE FINGER 2+V

[finger ap]
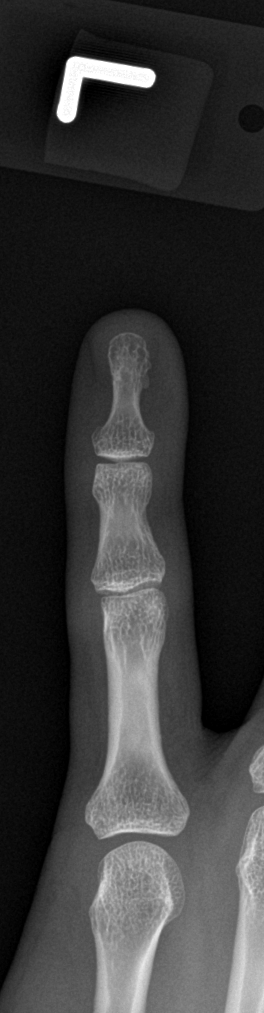

[finger obl]
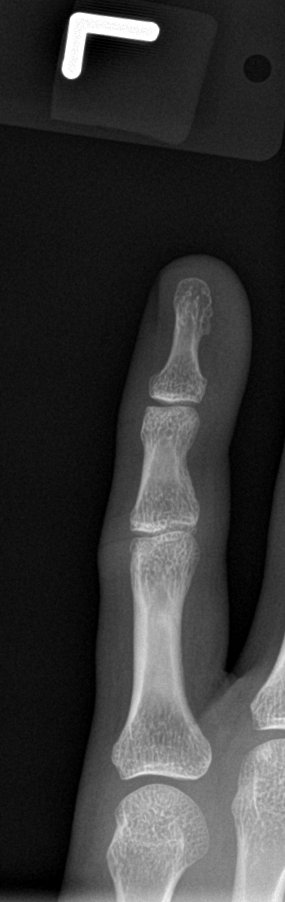

[finger lat]
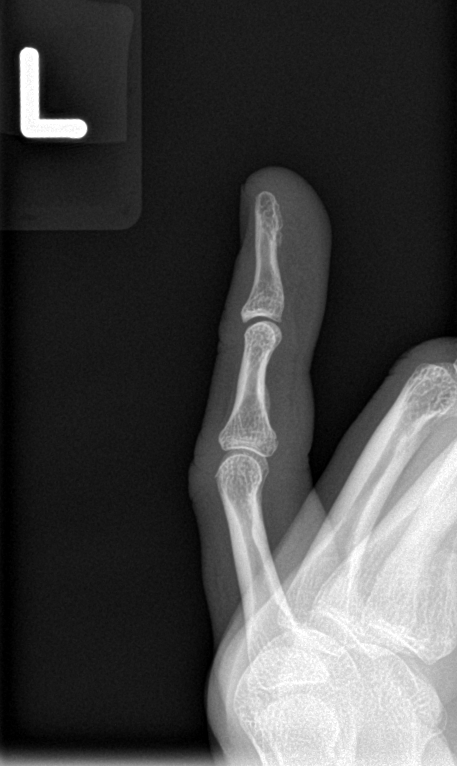

[3 of 3 positions shown; findings below may reference images not displayed]

FINDINGS: There is no evidence of fracture or dislocation. There is no
evidence of arthropathy or other focal bone abnormality. Soft
tissues are unremarkable.
IMPRESSION: Negative.

## 2020-09-13 ENCOUNTER — Other Ambulatory Visit: Payer: Self-pay | Admitting: Family Medicine

## 2020-09-13 NOTE — Telephone Encounter (Signed)
  Notes to clinic:  medication last filled by [facility] Review for refill   Requested Prescriptions  Pending Prescriptions Disp Refills   fluticasone (FLONASE) 50 MCG/ACT nasal spray [Pharmacy Med Name: FLUTICASONE PROP 50 MCG SPRAY] 48 mL 2    Sig: Place 1 spray into both nostrils in the morning and at bedtime.      Ear, Nose, and Throat: Nasal Preparations - Corticosteroids Passed - 09/13/2020  9:20 AM      Passed - Valid encounter within last 12 months    Recent Outpatient Visits           8 months ago Allergic rhinitis, unspecified seasonality, unspecified trigger   [facility], [place]   2 years ago Primary insomnia   [facility] Ro[facility] Maser [place], [place]   2 years ago Need for influenza vaccination   [facility] [place], [place]   2 years ago Chronic bilateral low back pain without sciatica   [facility] Ro[facility] Maser [place], [place]

## 2020-09-14 NOTE — Telephone Encounter (Signed)
Previous RL patient. Last seen 12/25/19.

## 2020-11-02 NOTE — Progress Notes (Deleted)
There were no vitals taken for this visit.   Subjective:    Patient ID: Joshua Davies, male    DOB: Oct 15, 1985, 35 y.o.   MRN: [id number]  HPI: Joshua Davies is a 35 y.o. male presenting on 11/03/2020 for comprehensive medical examination. Current medical complaints include:{Blank single:19197::"none","***"}  He currently lives with: Interim Problems from his last visit: {Blank single:19197::"yes","no"}  Functional Status Survey:    FALL RISK: Fall Risk  08/17/2018 05/23/2018  Falls in the past year? 0 No  Number falls in past yr: 0 -  Injury with Fall? 0 -    Depression Screen Depression screen [facility] 2/9 08/17/2018 05/23/2018  Decreased Interest 0 0  Down, Depressed, Hopeless 0 0  PHQ - 2 Score 0 0  Altered sleeping 0 1  Tired, decreased energy 0 0  Change in appetite 0 0  Feeling bad or failure about yourself  0 0  Trouble concentrating 0 0  Moving slowly or fidgety/restless 0 0  Suicidal thoughts 0 0  PHQ-9 Score 0 1  Difficult doing work/chores Not difficult at all -    Advanced Directives <no information>  Past Medical History:  Past Medical History:  Diagnosis Date  . Eosinophilic esophagitis     Surgical History:  No past surgical history on file.  Medications:  Current Outpatient Medications on File Prior to Visit  Medication Sig  . Cetirizine HCl (ZYRTEC PO) Take by mouth daily.  . cyclobenzaprine (FLEXERIL) 10 MG tablet TAKE 1 TABLET BY MOUTH THREE TIMES A DAY AS NEEDED FOR MUSCLE SPASMS (Patient not taking: Reported on 12/25/2019)  . fluticasone (FLONASE) 50 MCG/ACT nasal spray PLACE 1 SPRAY INTO BOTH NOSTRILS IN THE MORNING AND AT BEDTIME.  . meloxicam (MOBIC) 15 MG tablet TAKE 1 TABLET BY MOUTH EVERY DAY  . montelukast (SINGULAIR) 10 MG tablet Take 1 tablet (10 mg total) by mouth at bedtime.  . OMEPRAZOLE PO Take 20 mg by mouth daily as needed.   . predniSONE (DELTASONE) 50 MG tablet Take 1 tab daily with breakfast for 3 days  . [DISCONTINUED]  zolpidem (AMBIEN) 5 MG tablet Take 1 tablet (5 mg total) by mouth at bedtime as needed for sleep.   No current facility-administered medications on file prior to visit.    Allergies:  Allergies  Allergen Reactions  . Gluten Meal   . Wheat Bran     Social History:  Social History   Socioeconomic History  . Marital status: Single    Spouse name: Not on file  . Number of children: Not on file  . Years of education: Not on file  . Highest education level: Not on file  Occupational History  . Not on file  Tobacco Use  . Smoking status: Current Some Day Smoker    Types: Cigarettes  . Smokeless tobacco: Current User    Types: Snuff  Vaping Use  . Vaping Use: Never used  Substance and Sexual Activity  . Alcohol use: Yes    Alcohol/week: 3.0 standard drinks    Types: 3 Glasses of wine per week  . Drug use: Not Currently  . Sexual activity: Yes  Other Topics Concern  . Not on file  Social History Narrative  . Not on file   Social Determinants of Health   Financial Resource Strain: Not on file  Food Insecurity: Not on file  Transportation Needs: Not on file  Physical Activity: Not on file  Stress: Not on file  Social Connections: Not on  file  Intimate Partner Violence: Not on file   Social History   Tobacco Use  Smoking Status Current Some Day Smoker  . Types: Cigarettes  Smokeless Tobacco Current User  . Types: Snuff   Social History   Substance and Sexual Activity  Alcohol Use Yes  . Alcohol/week: 3.0 standard drinks  . Types: 3 Glasses of wine per week    Family History:  Family History  Problem Relation Age of Onset  . Kidney Stones Mother   . Diverticulitis Father   . Hypertension Father   . Hyperlipidemia Father   . Allergies Sister   . Pancreatic cancer Maternal Grandmother   . Lung cancer Maternal Grandfather   . Hypertension Paternal Grandfather     Past medical history, surgical history, medications, allergies, family history and social  history reviewed with patient today and changes made to appropriate areas of the chart.   Review of Systems - {ros master:310782} All other ROS negative except what is listed above and in the HPI.      Objective:    There were no vitals taken for this visit.  Wt Readings from Last 3 Encounters:  08/19/19 165 lb (74.8 kg)  08/16/19 165 lb (74.8 kg)  08/17/18 166 lb 14.4 oz (75.7 kg)    Physical Exam  Cognitive Testing - 6-CIT  Correct? Score   What year is it? {YES NO:22349} {Numbers; 0-4:31231} Yes = 0    No = 4  What month is it? {YES NO:22349} {Numbers; 0-4:31231} Yes = 0    No = 3  Remember:     [facility], [address], [place]     What time is it? {YES NO:22349} {Numbers; 0-4:31231} Yes = 0    No = 3  Count backwards from 20 to 1 {YES NO:22349} {Numbers; 0-4:31231} Correct = 0    1 error = 2   More than 1 error = 4  Say the months of the year in reverse. {YES NO:22349} {Numbers; 0-4:31231} Correct = 0    1 error = 2   More than 1 error = 4  What address did I ask you to remember? {YES NO:22349} {NUMBERS; 0-10:5044} Correct = 0  1 error = 2    2 error = 4    3 error = 6    4 error = 8    All wrong = 10       TOTAL SCORE  {Numbers; 2-35:57322}/02   Interpretation:  {Desc; normal/abnormal:11317::"Normal"}  Normal (0-7) Abnormal (8-28)    Results for orders placed or performed during the hospital encounter of 08/19/19  Rapid Strep Screen ([facility] ONLY)   Specimen: Other  Result Value Ref Range   Streptococcus, Group A Screen (Direct) NEGATIVE NEGATIVE  Culture, group A strep   Specimen: Throat  Result Value Ref Range   Specimen Description      THROAT Performed at [facility], [address], [place], [place] [address]    Special Requests      NONE Reflexed from [phone] Performed at [facility], [address], [place], [place] [address]    Culture      NO GROUP A STREP (S.PYOGENES) ISOLATED Performed at [facility],  [address] [place], [place] [address]    Report Status 08/21/2019 FINAL   KOH prep   Specimen: Throat  Result Value Ref Range   Specimen Description      THROAT Performed at  [facility], [address] [place], [place] [address]    Special Requests      Normal Performed at [facility], [address], [place], [place] [address]    [facility] Prep      BUDDING YEAST SEEN Performed at [facility], [address] [place], [place] [address]    Report Status 08/19/2019 FINAL       Assessment & Plan:   Problem List Items Addressed This Visit   None   Visit Diagnoses    Annual physical exam    -  Primary       Preventative Services:  Health Risk Assessment and Personalized Prevention Plan: Bone Mass Measurements: CVD Screening:  Colon Cancer Screening:  Depression Screening:  Diabetes Screening:  Glaucoma Screening:  Hepatitis B vaccine: Hepatitis C screening:  HIV Screening: Flu Vaccine: Lung cancer Screening: Obesity Screening:  Pneumonia Vaccines (2): STI Screening: PSA screening:  Discussed aspirin prophylaxis for myocardial infarction prevention and decision was it was not indicated  LABORATORY TESTING:  Health maintenance labs ordered today as discussed above.    IMMUNIZATIONS:   - Tdap: Tetanus vaccination status reviewed: {tetanus status:315746}. - Influenza: Postponed to flu season - Pneumovax: {Blank single:19197::"Up to date","Administered today","Not applicable","Refused","Given elsewhere"} - Prevnar: Not applicable - Zostavax vaccine: Not applicable  SCREENING: - Colonoscopy: Not applicable  Discussed with patient purpose of the colonoscopy is to detect colon cancer at curable precancerous or early stages    PATIENT COUNSELING:    Sexuality: Discussed sexually transmitted diseases, partner selection, use of condoms, avoidance of unintended pregnancy  and contraceptive alternatives.   Advised to avoid  cigarette smoking.  I discussed with the patient that most people either abstain from alcohol or drink within safe limits (<=14/week and <=4 drinks/occasion for males, <=7/weeks and <= 3 drinks/occasion for females) and that the risk for alcohol disorders and other health effects rises proportionally with the number of drinks per week and how often a drinker exceeds daily limits.  Discussed cessation/primary prevention of drug use and availability of treatment for abuse.   Diet: Encouraged to adjust caloric intake to maintain  or achieve ideal body weight, to reduce intake of dietary saturated fat and total fat, to limit sodium intake by avoiding high sodium foods and not adding table salt, and to maintain adequate dietary potassium and calcium preferably from fresh fruits, vegetables, and low-fat dairy products.    stressed the importance of regular exercise  Injury prevention: Discussed safety belts, safety helmets, smoke detector, smoking near bedding or upholstery.   Dental health: Discussed importance of regular tooth brushing, flossing, and dental visits.   Follow up plan: NEXT PREVENTATIVE PHYSICAL DUE IN 1 YEAR. No follow-ups on file.

## 2020-11-03 ENCOUNTER — Ambulatory Visit (INDEPENDENT_AMBULATORY_CARE_PROVIDER_SITE_OTHER): Payer: Managed Care, Other (non HMO) | Admitting: Nurse Practitioner

## 2020-11-03 ENCOUNTER — Other Ambulatory Visit: Payer: Self-pay

## 2020-11-03 ENCOUNTER — Encounter: Payer: Self-pay | Admitting: Nurse Practitioner

## 2020-11-03 DIAGNOSIS — F419 Anxiety disorder, unspecified: Secondary | ICD-10-CM

## 2020-11-03 DIAGNOSIS — Z0000 Encounter for general adult medical examination without abnormal findings: Secondary | ICD-10-CM | POA: Insufficient documentation

## 2020-11-03 MED ORDER — HYDROXYZINE HCL 10 MG PO TABS: 10.0000 mg | ORAL_TABLET | Freq: Three times a day (TID) | ORAL | 0 refills | Status: DC | PRN

## 2020-11-03 MED ORDER — LORAZEPAM 0.5 MG PO TABS: 0.5000 mg | ORAL_TABLET | Freq: Two times a day (BID) | ORAL | 0 refills | Status: DC | PRN

## 2020-11-03 MED ORDER — BUPROPION HCL ER (SR) 100 MG PO TB12: 100.0000 mg | ORAL_TABLET | Freq: Every day | ORAL | 0 refills | Status: DC

## 2020-11-03 NOTE — Progress Notes (Addendum)
BP 111/89   Pulse 77   Temp 98.4 F (36.9 C) (Oral)   Ht 6' 0.68" (1.846 m)   Wt 155 lb 3.2 oz (70.4 kg)   SpO2 98%   BMI 20.66 kg/m    Subjective:    Patient ID: Joshua Davies, male    DOB: 07-14-1986, 35 y.o.   MRN: [id number]  HPI: Joshua Davies is a 35 y.o. male  Chief Complaint  Patient presents with  . Anxiety   ANXIETY/STRESS Patient's Dad passed away from COVID in May 19, 2023.  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 Patient states that he has chest pain that comes on with his anxiety and panic attacks. Duration:worse Anxious mood: yes  Excessive worrying: yes Irritability: yes  Sweating: no Nausea: no Palpitations:no Hyperventilation: yes Panic attacks: yes Agoraphobia: no  Obscessions/compulsions: no Depressed mood: no Depression screen [facility] 2/9 11/03/2020 08/17/2018 05/23/2018  Decreased Interest 0 0 0  Down, Depressed, Hopeless 0 0 0  PHQ - 2 Score 0 0 0  Altered sleeping 0 0 1  Tired, decreased energy 1 0 0  Change in appetite 1 0 0  Feeling bad or failure about yourself  0 0 0  Trouble concentrating 0 0 0  Moving slowly or fidgety/restless 1 0 0  Suicidal thoughts 0 0 0  PHQ-9 Score 3 0 1  Difficult doing work/chores - Not difficult at all -   Anhedonia: no Weight changes: yes Insomnia: no .  Hypersomnia: no Fatigue/loss of energy: yes Feelings of worthlessness: no Feelings of guilt: no Impaired concentration/indecisiveness: no Suicidal ideations: no  Crying spells: no Recent Stressors/Life Changes: yes   Relationship problems: no   Family stress: yes     Financial stress: no    Job stress: yes    Recent death/loss: yes Relevant past medical, surgical, family and social history reviewed and updated as indicated. Interim medical history since our last visit reviewed. Allergies and medications reviewed and updated.  Review of Systems  Constitutional: Negative.    HENT: Negative.   Respiratory: Negative.   Cardiovascular: Positive for chest pain.  Psychiatric/Behavioral: Negative for dysphoric mood, sleep disturbance and suicidal ideas. The patient is nervous/anxious.     Per HPI unless specifically indicated above     Objective:    BP 111/89   Pulse 77   Temp 98.4 F (36.9 C) (Oral)   Ht 6' 0.68" (1.846 m)   Wt 155 lb 3.2 oz (70.4 kg)   SpO2 98%   BMI 20.66 kg/m   Wt Readings from Last 3 Encounters:  11/17/20 155 lb (70.3 kg)  11/03/20 155 lb 3.2 oz (70.4 kg)  08/19/19 165 lb (74.8 kg)    Physical Exam Vitals and nursing note reviewed.  Constitutional:      General: He is not in acute distress.    Appearance: Normal appearance. He is not ill-appearing, toxic-appearing or diaphoretic.  HENT:     Head: Normocephalic.     Right Ear: External ear normal.     Left Ear: External ear normal.     Nose: Nose normal. No congestion or rhinorrhea.     Mouth/Throat:     Mouth: Mucous membranes are moist.  Eyes:     General:        Right eye: No discharge.        Left eye: No discharge.     Extraocular Movements: Extraocular  movements intact.     Conjunctiva/sclera: Conjunctivae normal.     Pupils: Pupils are equal, round, and reactive to light.  Cardiovascular:     Rate and Rhythm: Normal rate and regular rhythm.     Heart sounds: No murmur heard.   Pulmonary:     Effort: Pulmonary effort is normal. No respiratory distress.     Breath sounds: Normal breath sounds. No wheezing, rhonchi or rales.  Abdominal:     General: Abdomen is flat. Bowel sounds are normal.  Musculoskeletal:     Cervical back: Normal range of motion and neck supple.  Skin:    General: Skin is warm and dry.     Capillary Refill: Capillary refill takes less than 2 seconds.  Neurological:     General: No focal deficit present.     Mental Status: He is alert and oriented to person, place, and time.  Psychiatric:        Mood and Affect: Mood normal.         Behavior: Behavior normal.        Thought Content: Thought content normal.        Judgment: Judgment normal.     Results for orders placed or performed during the hospital encounter of 08/19/19  Rapid Strep Screen ([facility] ONLY)   Specimen: Other  Result Value Ref Range   Streptococcus, Group A Screen (Direct) NEGATIVE NEGATIVE  Culture, group A strep   Specimen: Throat  Result Value Ref Range   Specimen Description      THROAT Performed at [facility] Urgent [facility], [address], [place], [place] [address]    Special Requests      NONE Reflexed from [id number] Performed at [facility]gent [facility], [address] [place], [place] [address]    Culture      NO GROUP A STREP (S.PYOGENES) ISOLATED Performed at [facility], [address] [place], [place] [address]    Report Status 08/21/2019 FINAL   KOH prep   Specimen: Throat  Result Value Ref Range   Specimen Description      THROAT Performed at [facility], [address], [place], [place] [address]    Special Requests      Normal Performed at [facility]gent [facility], [address], [place], [place] [address]    [facility] Prep      BUDDING YEAST SEEN Performed at [facility], [address] [place] [place], [place] [address]    Report Status 08/19/2019 FINAL       Assessment & Plan:   Problem List Items Addressed This Visit      Other   Anxiety    Uncontrolled.  Will start Wellbutrin to help with anxiety. Side effects and benefits of medication discussed with patient at visit today.  Short term supply of Lorazepam sent to help patient until Wellbutrin becomes therapeutic.  Discussed side effects and adverse effects of Benzos.  Patient given atarax to take for anxiety. Return in 2 weeks for reevaluation.       Relevant Medications   hydrOXYzine (ATARAX/VISTARIL) 10 MG tablet       Follow up plan: Return in about 2 weeks (around 11/17/2020) for Depression/Anxiety FU.

## 2020-11-10 ENCOUNTER — Other Ambulatory Visit: Payer: Self-pay | Admitting: Nurse Practitioner

## 2020-11-10 NOTE — Telephone Encounter (Signed)
Medication Refill - Medication: LORazepam (ATIVAN) 0.5 MG tablet     Preferred Pharmacy (with phone number or street name):  [id number] - [place], [place] - [address] Phone:  [phone]  Fax:  [phone]        Agent: Please be advised that RX refills may take up to 3 business days. We ask that you follow-up with your pharmacy.

## 2020-11-10 NOTE — Telephone Encounter (Signed)
Requested medication (s) are due for refill today: yes  Requested medication (s) are on the active medication list: yes  Last refill:  11/03/20 expired med 11/13/20  Future visit scheduled: yes  Notes to clinic:  med not delegated to NT to RF/med expired   Requested Prescriptions  Pending Prescriptions Disp Refills   LORazepam (ATIVAN) 0.5 MG tablet 10 tablet 0    Sig: Take 1 tablet (0.5 mg total) by mouth 2 (two) times daily as needed for up to 10 days for anxiety.      Not Delegated - Psychiatry:  Anxiolytics/Hypnotics Failed - 11/10/2020 10:45 AM      Failed - This refill cannot be delegated      Failed - Urine Drug Screen completed in last 360 days      Passed - Valid encounter within last 6 months    Recent Outpatient Visits           1 week ago Anxiety   [facility] Larae Grooms, NP   10 months ago Allergic rhinitis, unspecified seasonality, unspecified trigger   [facility] Particia Nearing, [place]   2 years ago Primary insomnia   [facility] Roosvelt Maser [place], [place]   2 years ago Need for influenza vaccination   [facility] Particia Nearing, [place]   2 years ago Chronic bilateral low back pain without sciatica   [facility] Particia Nearing, [place]       Future Appointments             In 1 week Larae Grooms, NP [facility], [facility]

## 2020-11-10 NOTE — Telephone Encounter (Signed)
Requested medication (s) are due for refill today: yes  Requested medication (s) are on the active medication list: yes  Last refill:  11/03/2020  Future visit scheduled: yes  Notes to clinic:  review for refill  Looks like short supply was given    Requested Prescriptions  Pending Prescriptions Disp Refills   hydrOXYzine (ATARAX/VISTARIL) 10 MG tablet [Pharmacy Med Name: HYDROXYZINE HCL 10 MG TABLET] 30 tablet 0    Sig: TAKE 1 TABLET BY MOUTH THREE TIMES A DAY AS NEEDED      Ear, Nose, and Throat:  Antihistamines Passed - 11/10/2020  1:03 PM      Passed - Valid encounter within last 12 months    Recent Outpatient Visits           1 week ago Anxiety   [facility] Larae Grooms, NP   10 months ago Allergic rhinitis, unspecified seasonality, unspecified trigger   [facility] Particia Nearing, [place]   2 years ago Primary insomnia   [facility] [place], [place], [place]   2 years ago Need for influenza vaccination   [facility] Particia Nearing, [place]   2 years ago Chronic bilateral low back pain without sciatica   [facility] Maurice March, [place], [place]       Future Appointments             In 1 week Larae Grooms, NP [facility], [facility]               LORazepam (ATIVAN) 0.5 MG tablet [Pharmacy Med Name: LORAZEPAM 0.5 MG TABLET] 10 tablet 0    Sig: Take 1 tablet (0.5 mg total) by mouth 2 (two) times daily as needed for up to 10 days for anxiety.      Not Delegated - Psychiatry:  Anxiolytics/Hypnotics Failed - 11/10/2020  1:03 PM      Failed - This refill cannot be delegated      Failed - Urine Drug Screen completed in last 360 days      Passed - Valid encounter within last 6 months    Recent Outpatient Visits           1 week ago Anxiety   [facility] Larae Grooms, NP   10 months ago Allergic rhinitis, unspecified seasonality, unspecified trigger    [facility] Particia Nearing, [place]   2 years ago Primary insomnia   [facility] Roosvelt Maser [place], [place]   2 years ago Need for influenza vaccination   [facility] Particia Nearing, [place]   2 years ago Chronic bilateral low back pain without sciatica   [facility] Particia Nearing, [place]       Future Appointments             In 1 week Larae Grooms, NP [facility], [facility]

## 2020-11-10 NOTE — Telephone Encounter (Signed)
Routing to provider  

## 2020-11-11 MED ORDER — LORAZEPAM 0.5 MG PO TABS: 0.5000 mg | ORAL_TABLET | Freq: Two times a day (BID) | ORAL | 0 refills | Status: AC | PRN

## 2020-11-16 NOTE — Progress Notes (Signed)
BP 121/77   Pulse 72   Temp 98.5 F (36.9 C)   Wt 155 lb (70.3 kg)   SpO2 97%   BMI 20.63 kg/m    Subjective:    Patient ID: Joshua Davies, male    DOB: 1986/06/09, 35 y.o.   MRN: [id number]  HPI: Joshua Davies is a 35 y.o. male  Chief Complaint  Patient presents with  . Anxiety    Increased heart rate   ANXIETY/STRESS Patient states that he takes the medication in the morning and by 11am the anxiety begins and continues to build throughout the day.  Patient states the last 5-6 days have been very bad.  He felt like the Wellbutrin was helping the first 4-5 days he started taking it then the worsening anxiety started.  He has been using the ativan PRN which does help to calm him down but he is trying not to use it a lot.   Duration:worse Anxious mood: yes  Excessive worrying: yes Irritability: yes  Sweating: no Nausea: no Palpitations:no Hyperventilation: yes Panic attacks: yes Agoraphobia: no  Obscessions/compulsions: no Depressed mood: no Anhedonia: no Weight changes: yes Insomnia: no .  Hypersomnia: no Fatigue/loss of energy: yes Feelings of worthlessness: no Feelings of guilt: no Impaired concentration/indecisiveness: no Suicidal ideations: no  Crying spells: no Recent Stressors/Life Changes: yes   Relationship problems: no   Family stress: yes     Financial stress: no    Job stress: yes    Recent death/loss: yes Relevant past medical, surgical, family and social history reviewed and updated as indicated. Interim medical history since our last visit reviewed. Allergies and medications reviewed and updated.  GAD 7 : Generalized Anxiety Score 11/17/2020 11/03/2020 05/23/2018  Nervous, Anxious, on Edge 3 3 1   Control/stop worrying 3 1 0  Worry too much - different things 3 0 0  Trouble relaxing 3 1 1   Restless 3 0 0  Easily annoyed or irritable 3 2 0  Afraid - awful might happen 3 1 0  Total GAD 7 Score 21 8 2   Anxiety Difficulty Very difficult - -       Review of Systems  Constitutional: Negative.   HENT: Negative.   Respiratory: Negative.   Cardiovascular: Positive for chest pain.  Psychiatric/Behavioral: Negative for dysphoric mood, sleep disturbance and suicidal ideas. The patient is nervous/anxious.     Per HPI unless specifically indicated above     Objective:    BP 121/77   Pulse 72   Temp 98.5 F (36.9 C)   Wt 155 lb (70.3 kg)   SpO2 97%   BMI 20.63 kg/m   Wt Readings from Last 3 Encounters:  11/17/20 155 lb (70.3 kg)  11/03/20 155 lb 3.2 oz (70.4 kg)  08/19/19 165 lb (74.8 kg)    Physical Exam Vitals and nursing note reviewed.  Constitutional:      General: He is not in acute distress.    Appearance: Normal appearance. He is not ill-appearing, toxic-appearing or diaphoretic.  HENT:     Head: Normocephalic.     Right Ear: External ear normal.     Left Ear: External ear normal.     Nose: Nose normal. No congestion or rhinorrhea.     Mouth/Throat:     Mouth: Mucous membranes are moist.  Eyes:     General:        Right eye: No discharge.        Left eye: No discharge.  Extraocular Movements: Extraocular movements intact.     Conjunctiva/sclera: Conjunctivae normal.     Pupils: Pupils are equal, round, and reactive to light.  Cardiovascular:     Rate and Rhythm: Normal rate and regular rhythm.     Heart sounds: No murmur heard.   Pulmonary:     Effort: Pulmonary effort is normal. No respiratory distress.     Breath sounds: Normal breath sounds. No wheezing, rhonchi or rales.  Abdominal:     General: Abdomen is flat. Bowel sounds are normal.  Musculoskeletal:     Cervical back: Normal range of motion and neck supple.  Skin:    General: Skin is warm and dry.     Capillary Refill: Capillary refill takes less than 2 seconds.  Neurological:     General: No focal deficit present.     Mental Status: He is alert and oriented to person, place, and time.  Psychiatric:        Mood and Affect: Mood  normal.        Behavior: Behavior normal.        Thought Content: Thought content normal.        Judgment: Judgment normal.     Results for orders placed or performed during the hospital encounter of 08/19/19  Rapid Strep Screen ([facility] ONLY)   Specimen: Other  Result Value Ref Range   Streptococcus, Group A Screen (Direct) NEGATIVE NEGATIVE  Culture, group A strep   Specimen: Throat  Result Value Ref Range   Specimen Description      THROAT Performed at [facility]gent [facility], [address], [place], [place] [address]    Special Requests      NONE Reflexed from [phone] Performed at [facility], [address], [place], [place] [address]    Culture      NO GROUP A STREP (S.PYOGENES) ISOLATED Performed at [facility], [address] [place], [place] [address]    Report Status 08/21/2019 FINAL   KOH prep   Specimen: Throat  Result Value Ref Range   Specimen Description      THROAT Performed at [facility], [address] [place], [place] [address]    Special Requests      Normal Performed at [facility], [address], [place], [place] [address]    [facility] Prep      BUDDING YEAST SEEN Performed at [facility], [address] [place] [place], [place] [address]    Report Status 08/19/2019 FINAL       Assessment & Plan:   Problem List Items Addressed This Visit      Other   Anxiety - Primary    Chronic.  Uncontrolled.  Changed Wellbutrin from SR to XL to see if that will help with anxiety throughout the day.  Recommend using Buspar as needed to help control anxiety.  Discussed with patient that he can use it 3x daily.  If medications do not control his symptoms and he continues to have worsening anxiety, will change to a different class of medication.  Can continue Lorazepam PRN while adjusting medications to find the right regimen for him. Side effects and benefits of medication discussed with patient at  visit today.  Return in 2 weeks for reevaluation.       Relevant Medications   buPROPion (WELLBUTRIN XL) 150 MG 24 hr tablet   busPIRone (BUSPAR) 5 MG tablet       Follow  up plan: Return in about 2 weeks (around 12/01/2020) for Depression/Anxiety FU.

## 2020-11-17 ENCOUNTER — Ambulatory Visit: Payer: Managed Care, Other (non HMO) | Admitting: Nurse Practitioner

## 2020-11-17 ENCOUNTER — Other Ambulatory Visit: Payer: Self-pay

## 2020-11-17 ENCOUNTER — Encounter: Payer: Self-pay | Admitting: Nurse Practitioner

## 2020-11-17 VITALS — BP 121/77 | HR 72 | Temp 98.5°F | Wt 155.0 lb

## 2020-11-17 DIAGNOSIS — F419 Anxiety disorder, unspecified: Secondary | ICD-10-CM

## 2020-11-17 MED ORDER — BUSPIRONE HCL 5 MG PO TABS: 5.0000 mg | ORAL_TABLET | Freq: Three times a day (TID) | ORAL | 0 refills | Status: DC

## 2020-11-17 MED ORDER — BUPROPION HCL ER (XL) 150 MG PO TB24: 150.0000 mg | ORAL_TABLET | Freq: Every day | ORAL | 0 refills | Status: DC

## 2020-11-17 NOTE — Assessment & Plan Note (Signed)
Chronic.  Uncontrolled.  Changed Wellbutrin from SR to XL to see if that will help with anxiety throughout the day.  Recommend using Buspar as needed to help control anxiety.  Discussed with patient that he can use it 3x daily.  If medications do not control his symptoms and he continues to have worsening anxiety, will change to a different class of medication.  Can continue Lorazepam PRN while adjusting medications to find the right regimen for him. Side effects and benefits of medication discussed with patient at visit today.  Return in 2 weeks for reevaluation.

## 2020-11-17 NOTE — Assessment & Plan Note (Signed)
Uncontrolled.  Will start Wellbutrin to help with anxiety. Side effects and benefits of medication discussed with patient at visit today.  Short term supply of Lorazepam sent to help patient until Wellbutrin becomes therapeutic.  Discussed side effects and adverse effects of Benzos.  Patient given atarax to take for anxiety. Return in 2 weeks for reevaluation.

## 2020-11-29 NOTE — Progress Notes (Signed)
BP 120/78   Pulse 80   Temp (!) 97.1 F (36.2 C)   Wt 155 lb (70.3 kg)   SpO2 99%   BMI 20.63 kg/m    Subjective:    Patient ID: Joshua Davies, male    DOB: [birth year], 35 y.o.   MRN: [id number]  HPI: Joshua Davies is a 35 y.o. male  Chief Complaint  Patient presents with  . Anxiety   ANXIETY/STRESS Patient here to follow up in anxiety.  He reports that symptoms have improved since his last visit.  Patient states he does still have panic attacks but they do not last all day.  They last maybe 30 minutes.  Patient is taking Buspar at least 2 times per day.  Patient would like to stay at this dose to give the medication the full 4-6 weeks to take effect.   Duration: improving Anxious mood: yes  Excessive worrying: yes Irritability: yes  Sweating: no Nausea: no Palpitations:no Hyperventilation: yes Panic attacks: yes Agoraphobia: no  Obscessions/compulsions: no Depressed mood: no Anhedonia: no Weight changes: yes Insomnia: no .  Hypersomnia: no Fatigue/loss of energy: yes Feelings of worthlessness: no Feelings of guilt: no Impaired concentration/indecisiveness: no Suicidal ideations: no  Crying spells: no Recent Stressors/Life Changes: yes   Relationship problems: no   Family stress: yes     Financial stress: no    Job stress: yes    Recent death/loss: yes Relevant past medical, surgical, family and social history reviewed and updated as indicated. Interim medical history since our last visit reviewed. Allergies and medications reviewed and updated.  GAD 7 : Generalized Anxiety Score 12/01/2020 11/17/2020 11/03/2020 05/23/2018  Nervous, Anxious, on Edge 2 3 3 1   Control/stop worrying 2 3 1  0  Worry too much - different things 2 3 0 0  Trouble relaxing 1 3 1 1   Restless 2 3 0 0  Easily annoyed or irritable 3 3 2  0  Afraid - awful might happen 2 3 1  0  Total GAD 7 Score 14 21 8 2   Anxiety Difficulty Somewhat difficult Very difficult - -      Review of  Systems  Constitutional: Negative.   HENT: Negative.   Respiratory: Negative.   Cardiovascular: Positive for chest pain.  Psychiatric/Behavioral: Negative for dysphoric mood, sleep disturbance and suicidal ideas. The patient is nervous/anxious.     Per HPI unless specifically indicated above     Objective:    BP 120/78   Pulse 80   Temp (!) 97.1 F (36.2 C)   Wt 155 lb (70.3 kg)   SpO2 99%   BMI 20.63 kg/m   Wt Readings from Last 3 Encounters:  12/01/20 155 lb (70.3 kg)  11/17/20 155 lb (70.3 kg)  11/03/20 155 lb 3.2 oz (70.4 kg)    Physical Exam Vitals and nursing note reviewed.  Constitutional:      General: He is not in acute distress.    Appearance: Normal appearance. He is not ill-appearing, toxic-appearing or diaphoretic.  HENT:     Head: Normocephalic.     Right Ear: External ear normal.     Left Ear: External ear normal.     Nose: Nose normal. No congestion or rhinorrhea.     Mouth/Throat:     Mouth: Mucous membranes are moist.  Eyes:     General:        Right eye: No discharge.        Left eye: No discharge.  Extraocular Movements: Extraocular movements intact.     Conjunctiva/sclera: Conjunctivae normal.     Pupils: Pupils are equal, round, and reactive to light.  Cardiovascular:     Rate and Rhythm: Normal rate and regular rhythm.     Heart sounds: No murmur heard.   Pulmonary:     Effort: Pulmonary effort is normal. No respiratory distress.     Breath sounds: Normal breath sounds. No wheezing, rhonchi or rales.  Abdominal:     General: Abdomen is flat. Bowel sounds are normal.  Musculoskeletal:     Cervical back: Normal range of motion and neck supple.  Skin:    General: Skin is warm and dry.     Capillary Refill: Capillary refill takes less than 2 seconds.  Neurological:     General: No focal deficit present.     Mental Status: He is alert and oriented to person, place, and time.  Psychiatric:        Mood and Affect: Mood normal.         Behavior: Behavior normal.        Thought Content: Thought content normal.        Judgment: Judgment normal.     Results for orders placed or performed during the hospital encounter of 08/19/19  Rapid Strep Screen ([facility] ONLY)   Specimen: Other  Result Value Ref Range   Streptococcus, Group A Screen (Direct) NEGATIVE NEGATIVE  Culture, group A strep   Specimen: Throat  Result Value Ref Range   Specimen Description      THROAT Performed at [facility], [address] [place], [place] [address]    Special Requests      NONE Reflexed from [id number] Performed at [facility]gent [facility], [address] [place], [place] [address]    Culture      NO GROUP A STREP (S.PYOGENES) ISOLATED Performed at [facility], [address], [place], [place] [address]    Report Status 08/21/2019 FINAL   KOH prep   Specimen: Throat  Result Value Ref Range   Specimen Description      THROAT Performed at [facility], [address], [place], [place] [address]    Special Requests      Normal Performed at [facility], [address], [place], [place] [address]    [facility] Prep      BUDDING YEAST SEEN Performed at [facility], [address] [place] [place], [place] [address]    Report Status 08/19/2019 FINAL       Assessment & Plan:   Problem List Items Addressed This Visit      Other   Anxiety - Primary    Chronic.  Ongoing.  Symptoms are improving.  Continue with current dose of medication.  Will reassess in one month to see if patient needs increased dose.  Refills sent today.       Relevant Medications   buPROPion (WELLBUTRIN XL) 150 MG 24 hr tablet   busPIRone (BUSPAR) 5 MG tablet       Follow up plan: Return in about 1 month (around 12/31/2020) for Depression/Anxiety FU.    A total of 20 minutes were spent on this encounter today.  When total time is documented, this includes both the face-to-face and non-face-to-face  time personally spent before, during and after the visit on the date of the encounter.

## 2020-12-01 ENCOUNTER — Ambulatory Visit: Payer: Managed Care, Other (non HMO) | Admitting: Nurse Practitioner

## 2020-12-01 ENCOUNTER — Encounter: Payer: Self-pay | Admitting: Nurse Practitioner

## 2020-12-01 ENCOUNTER — Other Ambulatory Visit: Payer: Self-pay

## 2020-12-01 VITALS — BP 120/78 | HR 80 | Temp 97.1°F | Wt 155.0 lb

## 2020-12-01 DIAGNOSIS — F419 Anxiety disorder, unspecified: Secondary | ICD-10-CM

## 2020-12-01 MED ORDER — BUSPIRONE HCL 5 MG PO TABS: 5.0000 mg | ORAL_TABLET | Freq: Three times a day (TID) | ORAL | 0 refills | Status: DC

## 2020-12-01 MED ORDER — BUPROPION HCL ER (XL) 150 MG PO TB24
150.0000 mg | ORAL_TABLET | Freq: Every day | ORAL | 0 refills | Status: DC
Start: 2020-12-01 — End: 2021-01-18

## 2020-12-01 NOTE — Assessment & Plan Note (Signed)
Chronic.  Ongoing.  Symptoms are improving.  Continue with current dose of medication.  Will reassess in one month to see if patient needs increased dose.  Refills sent today.

## 2020-12-24 ENCOUNTER — Other Ambulatory Visit: Payer: Self-pay | Admitting: Family Medicine

## 2020-12-24 NOTE — Telephone Encounter (Signed)
Requested medications are due for refill today.  yes  Requested medications are on the active medications list.  yes  Last refill. 12/25/2019  Future visit scheduled.   yes  Notes to clinic.  Rx is expired.

## 2020-12-24 NOTE — Telephone Encounter (Signed)
Routing to provider  

## 2020-12-24 NOTE — Telephone Encounter (Signed)
Pt last apt on 12/01/2020 next apt on 01/01/2021

## 2021-01-01 ENCOUNTER — Ambulatory Visit: Payer: Managed Care, Other (non HMO) | Admitting: Nurse Practitioner

## 2021-01-01 ENCOUNTER — Encounter: Payer: Self-pay | Admitting: Nurse Practitioner

## 2021-01-01 ENCOUNTER — Other Ambulatory Visit: Payer: Self-pay

## 2021-01-01 VITALS — BP 113/75 | HR 73 | Temp 98.3°F | Wt 153.0 lb

## 2021-01-01 DIAGNOSIS — F419 Anxiety disorder, unspecified: Secondary | ICD-10-CM | POA: Diagnosis not present

## 2021-01-01 MED ORDER — TRAZODONE HCL 50 MG PO TABS: 25.0000 mg | ORAL_TABLET | Freq: Every day | ORAL | 0 refills | Status: DC | PRN

## 2021-01-01 NOTE — Progress Notes (Signed)
Established Patient Office Visit  Subjective:  Patient ID: Joshua Davies, male    DOB: 07-14-1986  Age: 34 y.o. MRN: [id number]  CC:  Chief Complaint  Patient presents with  . Anxiety     HPI Joshua Davies presents for follow-up on anxiety. He said that his anxiety had gotten better for the first 3 weeks of changing to wellbutrin xl 129m daily, but then the past few days his anxiety has returned. He also noticed that he has had less appetite since starting the wellbutrin. He endorses unintentional weight loss of about 20 pounds in the last 7 months.  ANXIETY/STRESS   Duration:better, then last few days has increased slightly Anxious mood: no --typically occurs mid-day Excessive worrying: no Irritability: yes  Sweating: no Nausea: no Palpitations:no Hyperventilation: no Panic attacks: yes Agoraphobia: no  Obscessions/compulsions: no Depressed mood: no Depression screen [facility] 01/01/2021 12/01/2020 11/17/2020 11/03/2020 08/17/2018  Decreased Interest 0 0 1 0 0  Down, Depressed, Hopeless 0 0 0 0 0  PHQ - 2 Score 0 0 1 0 0  Altered sleeping 0 - 0 0 0  Tired, decreased energy 1 - 0 1 0  Change in appetite 1 - 0 1 0  Feeling bad or failure about yourself  0 - 0 0 0  Trouble concentrating 0 - 1 0 0  Moving slowly or fidgety/restless 0 - 3 1 0  Suicidal thoughts 0 - 0 0 0  PHQ-9 Score 2 - 5 3 0  Difficult doing work/chores Somewhat difficult - Very difficult - Not difficult at all   GAD 7 : Generalized Anxiety Score 01/01/2021 12/01/2020 11/17/2020 11/03/2020  Nervous, Anxious, on Edge '1 2 3 3  ' Control/stop worrying 0 '2 3 1  ' Worry too much - different things 0 2 3 0  Trouble relaxing '1 1 3 1  ' Restless '1 2 3 ' 0  Easily annoyed or irritable '3 3 3 2  ' Afraid - awful might happen 0 '2 3 1  ' Total GAD 7 Score '6 14 21 8  ' Anxiety Difficulty Not difficult at all Somewhat difficult Very difficult -    Anhedonia: no Weight changes: no Insomnia: no  Hypersomnia: no Fatigue/loss of  energy: yes Feelings of worthlessness: no Feelings of guilt: no Impaired concentration/indecisiveness: no Suicidal ideations: no  Crying spells: no Recent Stressors/Life Changes: no   Relationship problems: no   Family stress: no     Financial stress: no    Job stress: no    Recent death/loss: no   Past Medical History:  Diagnosis Date  . Eosinophilic esophagitis     History reviewed. No pertinent surgical history.  Family History  Problem Relation Age of Onset  . Kidney Stones Mother   . Diverticulitis Father   . Hypertension Father   . Hyperlipidemia Father   . Allergies Sister   . Pancreatic cancer Maternal Grandmother   . Lung cancer Maternal Grandfather   . Hypertension Paternal Grandfather     Social History   Socioeconomic History  . Marital status: Single    Spouse name: Not on file  . Number of children: Not on file  . Years of education: Not on file  . Highest education level: Not on file  Occupational History  . Not on file  Tobacco Use  . Smoking status: Current Some Day Smoker    Types: Cigarettes  . Smokeless tobacco: Current User    Types: Snuff  Vaping Use  . Vaping Use: Never used  Substance and Sexual Activity  . Alcohol use: Yes    Alcohol/week: 12.0 - 15.0 standard drinks    Types: 12 - 15 Cans of beer per week  . Drug use: Not Currently  . Sexual activity: Yes  Other Topics Concern  . Not on file  Social History Narrative  . Not on file   Social Determinants of Health   Financial Resource Strain: Not on file  Food Insecurity: Not on file  Transportation Needs: Not on file  Physical Activity: Not on file  Stress: Not on file  Social Connections: Not on file  Intimate Partner Violence: Not on file    Outpatient Medications Prior to Visit  Medication Sig Dispense Refill  . buPROPion (WELLBUTRIN XL) 150 MG 24 hr tablet Take 1 tablet (150 mg total) by mouth daily. 45 tablet 0  . Cetirizine HCl (ZYRTEC PO) Take by mouth daily.     . fluticasone (FLONASE) 50 MCG/ACT nasal spray PLACE 1 SPRAY INTO BOTH NOSTRILS IN THE MORNING AND AT BEDTIME. 48 mL 2  . montelukast (SINGULAIR) 10 MG tablet TAKE 1 TABLET BY MOUTH EVERYDAY AT BEDTIME 90 tablet 3  . OMEPRAZOLE PO Take 20 mg by mouth daily as needed.     . busPIRone (BUSPAR) 5 MG tablet Take 1 tablet (5 mg total) by mouth 3 (three) times daily. (Patient not taking: Reported on 01/01/2021) 90 tablet 0  . hydrOXYzine (ATARAX/VISTARIL) 10 MG tablet Take 1 tablet (10 mg total) by mouth 3 (three) times daily as needed. (Patient not taking: Reported on 01/01/2021) 30 tablet 0   No facility-administered medications prior to visit.    Allergies  Allergen Reactions  . Gluten Meal   . Wheat Bran     ROS Review of Systems  Constitutional: Positive for appetite change (decreased appetite with medication change), fatigue and unexpected weight change (has lost about 20 pounds in last 7-8 months).  Respiratory: Negative.   Cardiovascular: Negative.   Gastrointestinal: Positive for abdominal pain (on days that he works). Negative for constipation and diarrhea.  Genitourinary: Negative.   Musculoskeletal: Negative.   Neurological: Positive for tremors (feels tremors in his hands, however doesn't see them shaking). Negative for dizziness and light-headedness.  Psychiatric/Behavioral: The patient is nervous/anxious.       Objective:    Physical Exam Vitals and nursing note reviewed.  Constitutional:      Appearance: Normal appearance.  HENT:     Head: Normocephalic.  Eyes:     Conjunctiva/sclera: Conjunctivae normal.  Cardiovascular:     Rate and Rhythm: Normal rate and regular rhythm.     Pulses: Normal pulses.     Heart sounds: Normal heart sounds.  Pulmonary:     Effort: Pulmonary effort is normal.     Breath sounds: Normal breath sounds.  Musculoskeletal:     Cervical back: Normal range of motion.  Skin:    General: Skin is warm and dry.  Neurological:     General:  No focal deficit present.     Mental Status: He is alert and oriented to person, place, and time.  Psychiatric:        Mood and Affect: Mood normal.        Behavior: Behavior normal.        Thought Content: Thought content normal.        Judgment: Judgment normal.     BP 113/75   Pulse 73   Temp 98.3 F (36.8 C) (Oral)   Wt 153 lb (  69.4 kg)   SpO2 98%   BMI 20.37 kg/m  Wt Readings from Last 3 Encounters:  01/01/21 153 lb (69.4 kg)  12/01/20 155 lb (70.3 kg)  11/17/20 155 lb (70.3 kg)     There are no preventive care reminders to display for this patient.  There are no preventive care reminders to display for this patient.  Lab Results  Component Value Date   TSH 0.799 08/17/2018   Lab Results  Component Value Date   WBC 6.1 08/17/2018   HGB 15.9 08/17/2018   HCT 46.4 08/17/2018   MCV 86 08/17/2018   PLT 363 08/17/2018   Lab Results  Component Value Date   NA 138 08/17/2018   K 4.2 08/17/2018   CO2 22 08/17/2018   GLUCOSE 69 08/17/2018   BUN 18 08/17/2018   CREATININE 1.09 08/17/2018   BILITOT 0.5 08/17/2018   ALKPHOS 99 08/17/2018   AST 25 08/17/2018   ALT 58 (H) 08/17/2018   PROT 7.2 08/17/2018   ALBUMIN 4.9 08/17/2018   CALCIUM 10.0 08/17/2018   Lab Results  Component Value Date   CHOL 173 08/17/2018   Lab Results  Component Value Date   HDL 39 (L) 08/17/2018   Lab Results  Component Value Date   LDLCALC 112 (H) 08/17/2018   Lab Results  Component Value Date   TRIG 110 08/17/2018   No results found for: CHOLHDL No results found for: HGBA1C    Assessment & Plan:   Problem List Items Addressed This Visit      Other   Anxiety - Primary    Symptoms were better then the last few days his anxiety has returned. He has noticed decreased appetite with changing wellbutrin, do not want to increase the dose for this decrease in appetite. He stated the buspar made his anxiety worse and the hydroxyzine made him sleepy. Will trial trazadone 68m  prn panic. Discussed taking this on a day off to see how it makes him feel. He has been taking benadryl to help with anxiety which calms him down, but makes him drowsy. Discussed changing from wellbutrin to SSRI like lexapro, however he is hesitant at this time since the wellbutrin helped at first. With the recent weight loss and tremors, I recommend checking CMP, CBC, and TSH as this has not been checked in 3 years. He stated he does not do well with blood draws and would need his wife with him. Orders placed for the future and he can schedule a lab appointment to have this completed. Follow-up in 4 weeks.      Relevant Medications   traZODone (DESYREL) 50 MG tablet   Other Relevant Orders   Comp Met (CMET)   CBC with Differential   TSH      Meds ordered this encounter  Medications  . traZODone (DESYREL) 50 MG tablet    Sig: Take 0.5 tablets (25 mg total) by mouth daily as needed for sleep (anxiety).    Dispense:  15 tablet    Refill:  0    Follow-up: Return in about 4 weeks (around 01/29/2021) for anxiety, also needs lab visit when he is ready.    LCharyl Dancer NP

## 2021-01-01 NOTE — Assessment & Plan Note (Signed)
Symptoms were better then the last few days his anxiety has returned. He has noticed decreased appetite with changing wellbutrin, do not want to increase the dose for this decrease in appetite. He stated the buspar made his anxiety worse and the hydroxyzine made him sleepy. Will trial trazadone 25mg  prn panic. Discussed taking this on a day off to see how it makes him feel. He has been taking benadryl to help with anxiety which calms him down, but makes him drowsy. Discussed changing from wellbutrin to SSRI like lexapro, however he is hesitant at this time since the wellbutrin helped at first. With the recent weight loss and tremors, I recommend checking CMP, CBC, and TSH as this has not been checked in 3 years. He stated he does not do well with blood draws and would need his wife with him. Orders placed for the future and he can schedule a lab appointment to have this completed. Follow-up in 4 weeks.

## 2021-01-01 NOTE — Patient Instructions (Signed)
It was great to see you!  I recommend you get some routine blood work done. You can call to schedule just a lab visit and bring your wife.   Let's follow-up in 4 weeks, sooner if you have concerns.  Take care,  Rodman Pickle, NP

## 2021-01-07 ENCOUNTER — Other Ambulatory Visit: Payer: Self-pay

## 2021-01-07 ENCOUNTER — Other Ambulatory Visit: Payer: Managed Care, Other (non HMO)

## 2021-01-17 ENCOUNTER — Other Ambulatory Visit: Payer: Self-pay | Admitting: Nurse Practitioner

## 2021-01-17 DIAGNOSIS — F419 Anxiety disorder, unspecified: Secondary | ICD-10-CM

## 2021-01-17 NOTE — Telephone Encounter (Signed)
Requested Prescriptions  Pending Prescriptions Disp Refills  . busPIRone (BUSPAR) 5 MG tablet [Pharmacy Med Name: BUSPIRONE HCL 5 MG TABLET] 90 tablet 0    Sig: TAKE 1 TABLET BY MOUTH THREE TIMES A DAY     Psychiatry: Anxiolytics/Hypnotics - Non-controlled Passed - 01/17/2021 11:31 AM      Passed - Valid encounter within last 6 months    Recent Outpatient Visits          2 weeks ago Anxiety   [facility] Mc[facility]wee, Jake Church, NP   1 month ago Anxiety   [facility] Larae Grooms, NP   2 months ago Anxiety   [facility] Larae Grooms, NP   2 months ago Anxiety   [facility] Larae Grooms, NP   1 year ago Allergic rhinitis, unspecified seasonality, unspecified trigger   [facility] Particia Nearing, PA-C      Future Appointments            In 1 week Larae Grooms, NP [facility]

## 2021-01-18 ENCOUNTER — Other Ambulatory Visit: Payer: Self-pay

## 2021-01-18 ENCOUNTER — Other Ambulatory Visit: Payer: Self-pay | Admitting: Nurse Practitioner

## 2021-01-18 DIAGNOSIS — F419 Anxiety disorder, unspecified: Secondary | ICD-10-CM

## 2021-01-18 MED ORDER — HYDROXYZINE HCL 10 MG PO TABS: 10.0000 mg | ORAL_TABLET | Freq: Three times a day (TID) | ORAL | 0 refills | Status: DC | PRN

## 2021-01-18 NOTE — Telephone Encounter (Signed)
Pharmacy requesting changes to original Rx- #90 day supply. Sent for PCP review

## 2021-01-18 NOTE — Telephone Encounter (Signed)
Scheduled 6/3

## 2021-01-18 NOTE — Telephone Encounter (Signed)
Ordered by provider today.refusing this duplicate request.

## 2021-01-29 ENCOUNTER — Other Ambulatory Visit: Payer: Self-pay | Admitting: Nurse Practitioner

## 2021-01-29 ENCOUNTER — Ambulatory Visit: Payer: Managed Care, Other (non HMO) | Admitting: Nurse Practitioner

## 2021-01-29 NOTE — Telephone Encounter (Signed)
  Notes to clinic:  Requesting a 90 day supply   Requested Prescriptions  Pending Prescriptions Disp Refills   traZODone (DESYREL) 50 MG tablet [Pharmacy Med Name: TRAZODONE 50 MG TABLET] 45 tablet 1    Sig: TAKE 1/2 TABLETS (25 MG TOTAL) BY MOUTH DAILY AS NEEDED FOR SLEEP (ANXIETY).      Psychiatry: Antidepressants - Serotonin Modulator Passed - 01/29/2021 10:31 AM      Passed - Valid encounter within last 6 months    Recent Outpatient Visits           4 weeks ago Anxiety   [facility], Jake Church, NP   1 month ago Anxiety   [facility] Larae Grooms, NP   2 months ago Anxiety   [facility] Larae Grooms, NP   2 months ago Anxiety   [facility] Larae Grooms, NP   1 year ago Allergic rhinitis, unspecified seasonality, unspecified trigger   [facility] Particia Nearing, [place]

## 2021-02-01 NOTE — Telephone Encounter (Signed)
Scheduled 6/16 pt has enough

## 2021-02-11 ENCOUNTER — Encounter: Payer: Self-pay | Admitting: Nurse Practitioner

## 2021-02-11 ENCOUNTER — Ambulatory Visit: Payer: Managed Care, Other (non HMO) | Admitting: Nurse Practitioner

## 2021-02-11 ENCOUNTER — Other Ambulatory Visit: Payer: Self-pay

## 2021-02-11 VITALS — BP 126/77 | HR 80 | Temp 98.6°F | Wt 155.5 lb

## 2021-02-11 DIAGNOSIS — F419 Anxiety disorder, unspecified: Secondary | ICD-10-CM

## 2021-02-11 MED ORDER — HYDROXYZINE HCL 10 MG PO TABS: 10.0000 mg | ORAL_TABLET | Freq: Three times a day (TID) | ORAL | 0 refills | Status: DC | PRN

## 2021-02-11 NOTE — Assessment & Plan Note (Addendum)
Chronic.  Controlled on current medication regimen.  Refill sent on Hydroxyzine. Will revisit blood work in 6 months. Will likely give lorazepam prior to patient coming in for blood work. Call sonner if concerns arise.

## 2021-02-11 NOTE — Progress Notes (Signed)
BP 126/77   Pulse 80   Temp 98.6 F (37 C)   Wt 155 lb 8 oz (70.5 kg)   SpO2 99%   BMI 20.70 kg/m    Subjective:    Patient ID: Joshua Davies, male    DOB: Feb 22, 1986, 35 y.o.   MRN: [id number]  HPI: Joshua Davies is a 35 y.o. male  Chief Complaint  Patient presents with   Anxiety   ANXIETY Patient states his panic attacks have been very minimal.  His last one was when he came to the office for blood work.  States he gets anxious at times but the medication is helping to control his anxiety.  Denies concerns at visit today.  Would like a refill of the Hydroxyzine.  Relevant past medical, surgical, family and social history reviewed and updated as indicated. Interim medical history since our last visit reviewed. Allergies and medications reviewed and updated.  Review of Systems  Psychiatric/Behavioral:  Negative for dysphoric mood, sleep disturbance and suicidal ideas. The patient is nervous/anxious.    Per HPI unless specifically indicated above     Objective:    BP 126/77   Pulse 80   Temp 98.6 F (37 C)   Wt 155 lb 8 oz (70.5 kg)   SpO2 99%   BMI 20.70 kg/m   Wt Readings from Last 3 Encounters:  02/11/21 155 lb 8 oz (70.5 kg)  01/01/21 153 lb (69.4 kg)  12/01/20 155 lb (70.3 kg)    Physical Exam Vitals and nursing note reviewed.  Constitutional:      General: He is not in acute distress.    Appearance: Normal appearance. He is not ill-appearing, toxic-appearing or diaphoretic.  HENT:     Head: Normocephalic.     Right Ear: External ear normal.     Left Ear: External ear normal.     Nose: Nose normal. No congestion or rhinorrhea.     Mouth/Throat:     Mouth: Mucous membranes are moist.  Eyes:     General:        Right eye: No discharge.        Left eye: No discharge.     Extraocular Movements: Extraocular movements intact.     Conjunctiva/sclera: Conjunctivae normal.     Pupils: Pupils are equal, round, and reactive to light.  Cardiovascular:      Rate and Rhythm: Normal rate and regular rhythm.     Heart sounds: No murmur heard. Pulmonary:     Effort: Pulmonary effort is normal. No respiratory distress.     Breath sounds: Normal breath sounds. No wheezing, rhonchi or rales.  Abdominal:     General: Abdomen is flat. Bowel sounds are normal.  Musculoskeletal:     Cervical back: Normal range of motion and neck supple.  Skin:    General: Skin is warm and dry.     Capillary Refill: Capillary refill takes less than 2 seconds.  Neurological:     General: No focal deficit present.     Mental Status: He is alert and oriented to person, place, and time.  Psychiatric:        Mood and Affect: Mood normal.        Behavior: Behavior normal.        Thought Content: Thought content normal.        Judgment: Judgment normal.    Results for orders placed or performed during the hospital encounter of 08/19/19  Rapid Strep Screen ([facility] ONLY)   Specimen: Other  Result Value Ref Range   Streptococcus, Group A Screen (Direct) NEGATIVE NEGATIVE  Culture, group A strep   Specimen: Throat  Result Value Ref Range   Specimen Description      THROAT Performed at [facility], [address], [place], [place] [address]    Special Requests      NONE Reflexed from [id number] Performed at [facility], [address] [place], [place] [address]    Culture      NO GROUP A STREP (S.PYOGENES) ISOLATED Performed at [facility], [address], [place], [place] [address]    Report Status 08/21/2019 FINAL   KOH prep   Specimen: Throat  Result Value Ref Range   Specimen Description      THROAT Performed at [facility], [address] [place], [place] [address]    Special Requests      Normal Performed at [facility], [address] [place], [place] [address]    [facility] Prep      BUDDING YEAST SEEN Performed at [facility], [address] [place] [place], [place]  [address]    Report Status 08/19/2019 FINAL       Assessment & Plan:   Problem List Items Addressed This Visit       Other   Anxiety - Primary    Chronic.  Controlled on current medication regimen.  Refill sent on Hydroxyzine. Will revisit blood work in 6 months. Will likely give lorazepam prior to patient coming in for blood work. Call sonner if concerns arise.       Relevant Medications   hydrOXYzine (ATARAX/VISTARIL) 10 MG tablet     Follow up plan: Return in about 6 months (around 08/13/2021) for Depression/Anxiety FU.

## 2021-03-05 ENCOUNTER — Other Ambulatory Visit: Payer: Self-pay | Admitting: Nurse Practitioner

## 2021-03-05 NOTE — Telephone Encounter (Signed)
Requested medication (s) are due for refill today - if patient is taking 3xd- yes- only 20 day Rx  Requested medication (s) are on the active medication list -yes  Future visit scheduled -yes  Last refill: 02/11/21  Notes to clinic: Request RF- sent for review   Requested Prescriptions  Pending Prescriptions Disp Refills   hydrOXYzine (ATARAX/VISTARIL) 10 MG tablet [Pharmacy Med Name: HYDROXYZINE HCL 10 MG TABLET] 60 tablet 0    Sig: TAKE 1 TABLET BY MOUTH THREE TIMES A DAY AS NEEDED      Ear, Nose, and Throat:  Antihistamines Passed - 03/05/2021  4:06 PM      Passed - Valid encounter within last 12 months    Recent Outpatient Visits           3 weeks ago Anxiety   [facility] Larae Grooms, NP   2 months ago Anxiety   [facility] Mc[facility]wee, Jake Church, NP   3 months ago Anxiety   [facility] Larae Grooms, NP   3 months ago Anxiety   [facility] Larae Grooms, NP   4 months ago Anxiety   [facility] Larae Grooms, NP       Future Appointments             In 5 months Larae Grooms, NP [facility]                 Requested Prescriptions  Pending Prescriptions Disp Refills   hydrOXYzine (ATARAX/VISTARIL) 10 MG tablet [Pharmacy Med Name: HYDROXYZINE HCL 10 MG TABLET] 60 tablet 0    Sig: TAKE 1 TABLET BY MOUTH THREE TIMES A DAY AS NEEDED      Ear, Nose, and Throat:  Antihistamines Passed - 03/05/2021  4:06 PM      Passed - Valid encounter within last 12 months    Recent Outpatient Visits           3 weeks ago Anxiety   [facility] Larae Grooms, NP   2 months ago Anxiety   [facility]e, Jake Church, NP   3 months ago Anxiety   [facility] Larae Grooms, NP   3 months ago Anxiety   [facility] Larae Grooms, NP   4 months ago Anxiety   [facility] Larae Grooms, NP        Future Appointments             In 5 months Larae Grooms, NP [facility]

## 2021-03-08 NOTE — Telephone Encounter (Signed)
Pt is scheduled 12/16

## 2021-03-09 MED ORDER — HYDROXYZINE HCL 10 MG PO TABS: 10.0000 mg | ORAL_TABLET | Freq: Three times a day (TID) | ORAL | 1 refills | Status: DC | PRN

## 2021-03-09 NOTE — Telephone Encounter (Signed)
Medication sent to the pharmacy.

## 2021-03-09 NOTE — Addendum Note (Signed)
Addended by: Larae Grooms on: 03/09/2021 11:55 AM   Modules accepted: Orders

## 2021-03-09 NOTE — Telephone Encounter (Signed)
Called pt he states that Joshua Davies told him that she would keep his medication going until his 6 month f/u. Please advise

## 2021-03-11 DIAGNOSIS — K200 Eosinophilic esophagitis: Secondary | ICD-10-CM

## 2021-04-03 ENCOUNTER — Other Ambulatory Visit: Payer: Self-pay | Admitting: Nurse Practitioner

## 2021-04-03 NOTE — Telephone Encounter (Signed)
last RF 03/09/21 #90 1 RF

## 2021-04-15 ENCOUNTER — Other Ambulatory Visit: Payer: Self-pay | Admitting: Nurse Practitioner

## 2021-04-15 DIAGNOSIS — F419 Anxiety disorder, unspecified: Secondary | ICD-10-CM

## 2021-04-15 NOTE — Telephone Encounter (Signed)
Requested medications are due for refill today N/A  Requested medications are on the active medication list No  Last refill 01/01/21  Last visit Was seen in June for anxiety, last seen for insomnia 07/2018  Future visit scheduled 07/2021  Notes to clinic It is noted by pharmacy that this is a dose change, Trazodone is not listed on current med list at all in any dose. Please assess.

## 2021-05-10 ENCOUNTER — Ambulatory Visit (INDEPENDENT_AMBULATORY_CARE_PROVIDER_SITE_OTHER): Payer: Managed Care, Other (non HMO) | Admitting: Gastroenterology

## 2021-05-10 ENCOUNTER — Encounter: Payer: Self-pay | Admitting: Gastroenterology

## 2021-05-10 ENCOUNTER — Other Ambulatory Visit: Payer: Self-pay

## 2021-05-10 VITALS — BP 135/91 | HR 76 | Temp 97.9°F | Ht 73.0 in | Wt 171.0 lb

## 2021-05-10 DIAGNOSIS — G8929 Other chronic pain: Secondary | ICD-10-CM | POA: Diagnosis not present

## 2021-05-10 DIAGNOSIS — R1013 Epigastric pain: Secondary | ICD-10-CM | POA: Diagnosis not present

## 2021-05-10 MED ORDER — PANTOPRAZOLE SODIUM 40 MG PO TBEC: 40.0000 mg | DELAYED_RELEASE_TABLET | Freq: Every day | ORAL | 6 refills | Status: DC

## 2021-05-10 NOTE — Progress Notes (Signed)
Gastroenterology Consultation  Referring Provider:     Larae Grooms, NP Primary Care Physician:  Larae Grooms, NP Primary Gastroenterologist:  Dr. Servando Snare     Reason for Consultation:     Abdominal pain        HPI:   Joshua Davies is a 35 y.o. y/o male referred for consultation & management of Abdominal pain by Dr. Larae Grooms, NP.  This patient comes in today with a history of abdominal pain.  The patient reports the abdominal discomfort to be off and on.  He was recently taking some anti-inflammatory medication due to a dog bite.  He also reports that he has been diagnosed with eosinophilic esophagitis in the past but is not having problems swallowing.  He thinks he may have excessive acid production as a cause of his symptoms.  He also reports that a lot of his symptoms started when he began taking medication for anxiety problems.  He lost his father approximate 1 year ago to Covid which has been very stressful for him.  There is no report of any unexplained weight loss black stools or bloody stools.  He also reports that his sister had an upper endoscopy with dilation of her esophagus. The patient is a Emergency planning/management officer locally and was recommended to come see me by his mother who is a patient of mine.  He denies anything that he does makes his symptoms any better or worse.  He is concerned that he may have peptic ulcer disease.  Past Medical History:  Diagnosis Date   Eosinophilic esophagitis     History reviewed. No pertinent surgical history.  Prior to Admission medications   Medication Sig Start Date End Date Taking? Authorizing Provider  buPROPion (WELLBUTRIN XL) 150 MG 24 hr tablet TAKE 1 TABLET BY MOUTH EVERY DAY 01/18/21  Yes Larae Grooms, NP  busPIRone (BUSPAR) 5 MG tablet TAKE 1 TABLET BY MOUTH THREE TIMES A DAY 04/15/21  Yes Larae Grooms, NP  fluticasone (FLONASE) 50 MCG/ACT nasal spray PLACE 1 SPRAY INTO BOTH NOSTRILS IN THE MORNING AND AT BEDTIME.  09/14/20  Yes Cannady, Jolene T, NP  hydrOXYzine (ATARAX/VISTARIL) 10 MG tablet Take 1 tablet (10 mg total) by mouth 3 (three) times daily as needed. 03/09/21  Yes Larae Grooms, NP  montelukast (SINGULAIR) 10 MG tablet TAKE 1 TABLET BY MOUTH EVERYDAY AT BEDTIME 12/24/20  Yes Larae Grooms, NP  OMEPRAZOLE PO Take 20 mg by mouth daily as needed.    Yes [provider]  pantoprazole (PROTONIX) 40 MG tablet Take 1 tablet (40 mg total) by mouth daily before breakfast. 05/10/21  Yes Midge Minium, MD  zolpidem (AMBIEN) 5 MG tablet Take 1 tablet (5 mg total) by mouth at bedtime as needed for sleep. 08/27/18 08/16/19  Particia Nearing, PA-C    Family History  Problem Relation Age of Onset   Kidney Stones Mother    Diverticulitis Father    Hypertension Father    Hyperlipidemia Father    Allergies Sister    Pancreatic cancer Maternal Grandmother    Lung cancer Maternal Grandfather    Hypertension Paternal Grandfather      Social History   Tobacco Use   Smoking status: Some Days    Types: Cigarettes   Smokeless tobacco: Current    Types: Snuff  Vaping Use   Vaping Use: Never used  Substance Use Topics   Alcohol use: Yes    Alcohol/week: 12.0 - 15.0 standard drinks    Types:  12 - 15 Cans of beer per week   Drug use: Not Currently    Allergies as of 05/10/2021 - Review Complete 05/10/2021  Allergen Reaction Noted   Gluten meal  05/23/2018   Wheat bran  05/23/2018    Review of Systems:    All systems reviewed and negative except where noted in HPI.   Physical Exam:  BP (!) 135/91 (BP Location: Left Arm, Patient Position: Sitting, Cuff Size: Large)   Pulse 76   Temp 97.9 F (36.6 C) (Temporal)   Ht 6\' 1"  (1.854 m)   Wt 171 lb (77.6 kg)   BMI 22.56 kg/m  No LMP for male patient. General:   Alert,  Well-developed, well-nourished, pleasant and cooperative in NAD Head:  Normocephalic and atraumatic. Eyes:  Sclera clear, no icterus.   Conjunctiva pink. Ears:   Normal auditory acuity. Neck:  Supple; no masses or thyromegaly. Lungs:  Respirations even and unlabored.  Clear throughout to auscultation.   No wheezes, crackles, or rhonchi. No acute distress. Heart:  Regular rate and rhythm; no murmurs, clicks, rubs, or gallops. Abdomen:  Normal bowel sounds.  No bruits.  Soft, non-tender and non-distended without masses, hepatosplenomegaly or hernias noted.  No guarding or rebound tenderness.  Negative Carnett sign.   Rectal:  Deferred.  Pulses:  Normal pulses noted. Extremities:  No clubbing or edema.  No cyanosis. Neurologic:  Alert and oriented x3;  grossly normal neurologically. Skin:  Intact without significant lesions or rashes.  No jaundice. Lymph Nodes:  No significant cervical adenopathy. Psych:  Alert and cooperative. Normal mood and affect.  Imaging Studies: No results found.  Assessment and Plan:   [name] is a 35 y.o. y/o male who comes in today with a history of abdominal discomfort which is usually in the epigastric area.  There is no report of any unexplained weight loss fevers chills nausea vomiting black stools or bloody stools.  The patient has symptoms consistent with possible reflux although he denies overt heartburn. The patient has been under a lot of stress since the passing of his father.  He also attributes some of the medications is possibly being the cause of some of his symptoms.  The patient will be put on Protonix 40 mg once a day to see if this helps his symptoms.  He has been told that if the symptoms do not improve that he should contact me and that he will then need a upper endoscopy.  The patient has been explained the plan and agrees with it.    34, MD. Midge Minium    Note: This dictation was prepared with Dragon dictation along with smaller phrase technology. Any transcriptional errors that result from this process are unintentional.

## 2021-05-12 ENCOUNTER — Other Ambulatory Visit: Payer: Self-pay | Admitting: Nurse Practitioner

## 2021-05-16 ENCOUNTER — Other Ambulatory Visit: Payer: Self-pay | Admitting: Nurse Practitioner

## 2021-05-16 DIAGNOSIS — F419 Anxiety disorder, unspecified: Secondary | ICD-10-CM

## 2021-06-20 ENCOUNTER — Other Ambulatory Visit: Payer: Self-pay | Admitting: Nurse Practitioner

## 2021-06-20 DIAGNOSIS — F419 Anxiety disorder, unspecified: Secondary | ICD-10-CM

## 2021-06-21 NOTE — Telephone Encounter (Signed)
Requested by interface surescripts too soon .  Requested Prescriptions  Refused Prescriptions Disp Refills  . busPIRone (BUSPAR) 5 MG tablet [Pharmacy Med Name: BUSPIRONE HCL 5 MG TABLET] 90 tablet 0    Sig: TAKE 1 TABLET BY MOUTH THREE TIMES A DAY     Psychiatry: Anxiolytics/Hypnotics - Non-controlled Passed - 06/20/2021  2:52 PM      Passed - Valid encounter within last 6 months    Recent Outpatient Visits          4 months ago Anxiety   [facility] Larae Grooms, NP   5 months ago Anxiety   [facility] [place], Jake Church, NP   6 months ago Anxiety   [facility] Larae Grooms, NP   7 months ago Anxiety   [facility] Larae Grooms, NP   7 months ago Anxiety   [facility] Larae Grooms, NP      Future Appointments            In 1 month Larae Grooms, NP [facility]           . buPROPion (WELLBUTRIN XL) 150 MG 24 hr tablet [Pharmacy Med Name: BUPROPION HCL XL 150 MG TABLET] 90 tablet 1    Sig: TAKE 1 TABLET BY MOUTH EVERY DAY     Psychiatry: Antidepressants - bupropion Failed - 06/20/2021  2:52 PM      Failed - Last BP in normal range    BP Readings from Last 1 Encounters:  05/10/21 (!) 135/91         Passed - Valid encounter within last 6 months    Recent Outpatient Visits          4 months ago Anxiety   [facility] Larae Grooms, NP   5 months ago Anxiety   [facility] [place], Jake Church, NP   6 months ago Anxiety   [facility] Larae Grooms, NP   7 months ago Anxiety   [facility] Larae Grooms, NP   7 months ago Anxiety   [facility] Larae Grooms, NP      Future Appointments            In 1 month Larae Grooms, NP [facility]

## 2021-06-21 NOTE — Telephone Encounter (Signed)
Requested by interface surescripts. Medication discontinued 02/11/21, dose change .  Requested Prescriptions  Refused Prescriptions Disp Refills  . traZODone (DESYREL) 50 MG tablet [Pharmacy Med Name: TRAZODONE 50 MG TABLET] 15 tablet 0    Sig: TAKE 1/2 TABLETS (25 MG TOTAL) BY MOUTH DAILY AS NEEDED FOR SLEEP (ANXIETY).     Psychiatry: Antidepressants - Serotonin Modulator Passed - 06/20/2021  2:52 PM      Passed - Valid encounter within last 6 months    Recent Outpatient Visits          4 months ago Anxiety   [facility] Larae Grooms, NP   5 months ago Anxiety   [facility] [place], Jake Church, NP   6 months ago Anxiety   [facility] Larae Grooms, NP   7 months ago Anxiety   [facility] Larae Grooms, NP   7 months ago Anxiety   [facility] Larae Grooms, NP      Future Appointments            In 1 month Larae Grooms, NP [facility], [facility]

## 2021-06-21 NOTE — Telephone Encounter (Signed)
Requested Prescriptions  Pending Prescriptions Disp Refills  . fluticasone (FLONASE) 50 MCG/ACT nasal spray [Pharmacy Med Name: FLUTICASONE PROP 50 MCG SPRAY] 48 mL 0    Sig: PLACE 1 SPRAY INTO BOTH NOSTRILS IN THE MORNING AND AT BEDTIME.     Ear, Nose, and Throat: Nasal Preparations - Corticosteroids Passed - 06/20/2021  2:52 PM      Passed - Valid encounter within last 12 months    Recent Outpatient Visits          4 months ago Anxiety   [facility] Larae Grooms, NP   5 months ago Anxiety   [facility] [place], Jake Church, NP   6 months ago Anxiety   [facility] Larae Grooms, NP   7 months ago Anxiety   [facility] Larae Grooms, NP   7 months ago Anxiety   [facility] Larae Grooms, NP      Future Appointments            In 1 month Larae Grooms, NP [facility], [facility]

## 2021-07-14 ENCOUNTER — Other Ambulatory Visit: Payer: Self-pay | Admitting: Nurse Practitioner

## 2021-07-14 NOTE — Telephone Encounter (Signed)
Requested Prescriptions  Pending Prescriptions Disp Refills  . hydrOXYzine (ATARAX/VISTARIL) 10 MG tablet [Pharmacy Med Name: HYDROXYZINE HCL 10 MG TABLET] 270 tablet 1    Sig: TAKE 1 TABLET BY MOUTH THREE TIMES A DAY AS NEEDED     Ear, Nose, and Throat:  Antihistamines Passed - 07/14/2021  3:08 PM      Passed - Valid encounter within last 12 months    Recent Outpatient Visits          5 months ago Anxiety   [facility] Larae Grooms, NP   6 months ago Anxiety   [facility] [place], Jake Church, NP   7 months ago Anxiety   [facility] Larae Grooms, NP   7 months ago Anxiety   [facility] Larae Grooms, NP   8 months ago Anxiety   [facility] Larae Grooms, NP      Future Appointments            In 1 month Larae Grooms, NP [facility], [facility]

## 2021-07-27 ENCOUNTER — Other Ambulatory Visit: Payer: Self-pay | Admitting: Nurse Practitioner

## 2021-07-27 DIAGNOSIS — F419 Anxiety disorder, unspecified: Secondary | ICD-10-CM

## 2021-07-28 NOTE — Telephone Encounter (Signed)
Requested Prescriptions  Pending Prescriptions Disp Refills  . busPIRone (BUSPAR) 5 MG tablet [Pharmacy Med Name: BUSPIRONE HCL 5 MG TABLET] 90 tablet 0    Sig: TAKE 1 TABLET BY MOUTH THREE TIMES A DAY     Psychiatry: Anxiolytics/Hypnotics - Non-controlled Passed - 07/27/2021  9:47 AM      Passed - Valid encounter within last 6 months    Recent Outpatient Visits          5 months ago Anxiety   [facility] Larae Grooms, NP   6 months ago Anxiety   [facility] [place], Jake Church, NP   7 months ago Anxiety   [facility] Larae Grooms, NP   8 months ago Anxiety   [facility] Larae Grooms, NP   8 months ago Anxiety   [facility] Larae Grooms, NP      Future Appointments            In 2 weeks Larae Grooms, NP [facility]           . buPROPion (WELLBUTRIN XL) 150 MG 24 hr tablet [Pharmacy Med Name: BUPROPION HCL XL 150 MG TABLET] 90 tablet 0    Sig: TAKE 1 TABLET BY MOUTH EVERY DAY     Psychiatry: Antidepressants - bupropion Failed - 07/27/2021  9:47 AM      Failed - Last BP in normal range    BP Readings from Last 1 Encounters:  05/10/21 (!) 135/91         Passed - Valid encounter within last 6 months    Recent Outpatient Visits          5 months ago Anxiety   [facility] Larae Grooms, NP   6 months ago Anxiety   [facility] [place], Jake Church, NP   7 months ago Anxiety   [facility] Larae Grooms, NP   8 months ago Anxiety   [facility] Larae Grooms, NP   8 months ago Anxiety   [facility] Larae Grooms, NP      Future Appointments            In 2 weeks Larae Grooms, NP [facility], [facility]

## 2021-08-10 ENCOUNTER — Encounter: Payer: Self-pay | Admitting: Nurse Practitioner

## 2021-08-11 MED ORDER — ALPRAZOLAM 0.5 MG PO TABS: 0.5000 mg | ORAL_TABLET | Freq: Two times a day (BID) | ORAL | 0 refills | Status: DC | PRN

## 2021-08-12 NOTE — Progress Notes (Signed)
BP 119/69    Pulse 77    Temp 98.3 F (36.8 C) (Oral)    Ht 6' 0.6" (1.844 m)    Wt 164 lb (74.4 kg)    SpO2 98%    BMI 21.88 kg/m    Subjective:    Patient ID: Joshua Davies, male    DOB: 1986/04/23, 35 y.o.   MRN: [id number]  HPI: Joshua Davies is a 35 y.o. male presenting on 08/13/2021 for comprehensive medical examination. Current medical complaints include:none  He currently lives with: Interim Problems from his last visit: no  ANXIETY Patient states his anxiety hasn't been bad lately.  States it is much better controlled than it was before.  Denies SI.    Denies HA, CP, SOB, dizziness, palpitations, visual changes, and lower extremity swelling.   Depression Screen done today and results listed below:  Depression screen [facility] 2/9 08/13/2021 02/11/2021 01/01/2021 12/01/2020 11/17/2020  Decreased Interest 0 0 0 0 1  Down, Depressed, Hopeless 0 0 0 0 0  PHQ - 2 Score 0 0 0 0 1  Altered sleeping 0 0 0 - 0  Tired, decreased energy 0 0 1 - 0  Change in appetite 0 0 1 - 0  Feeling bad or failure about yourself  0 0 0 - 0  Trouble concentrating 0 0 0 - 1  Moving slowly or fidgety/restless 1 0 0 - 3  Suicidal thoughts 0 0 0 - 0  PHQ-9 Score 1 0 2 - 5  Difficult doing work/chores Somewhat difficult Not difficult at all Somewhat difficult - Very difficult    The patient does not have a history of falls. I did complete a risk assessment for falls. A plan of care for falls was documented.   Past Medical History:  Past Medical History:  Diagnosis Date   Eosinophilic esophagitis     Surgical History:  History reviewed. No pertinent surgical history.  Medications:  Current Outpatient Medications on File Prior to Visit  Medication Sig   fluticasone (FLONASE) 50 MCG/ACT nasal spray PLACE 1 SPRAY INTO BOTH NOSTRILS IN THE MORNING AND AT BEDTIME.   montelukast (SINGULAIR) 10 MG tablet TAKE 1 TABLET BY MOUTH EVERYDAY AT BEDTIME   pantoprazole (PROTONIX) 40 MG tablet Take 1 tablet  (40 mg total) by mouth daily before breakfast.   [DISCONTINUED] zolpidem (AMBIEN) 5 MG tablet Take 1 tablet (5 mg total) by mouth at bedtime as needed for sleep.   No current facility-administered medications on file prior to visit.    Allergies:  Allergies  Allergen Reactions   Gluten Meal    Wheat Bran     Social History:  Social History   Socioeconomic History   Marital status: Single    Spouse name: Not on file   Number of children: Not on file   Years of education: Not on file   Highest education level: Not on file  Occupational History   Not on file  Tobacco Use   Smoking status: Some Days    Types: Cigarettes   Smokeless tobacco: Current    Types: Snuff  Vaping Use   Vaping Use: Never used  Substance and Sexual Activity   Alcohol use: Yes    Alcohol/week: 20.0 standard drinks    Types: 20 Cans of beer per week   Drug use: Not Currently   Sexual activity: Yes  Other Topics Concern   Not on file  Social History Narrative   Not on file  Social Determinants of Health   Financial Resource Strain: Not on file  Food Insecurity: Not on file  Transportation Needs: Not on file  Physical Activity: Not on file  Stress: Not on file  Social Connections: Not on file  Intimate Partner Violence: Not on file   Social History   Tobacco Use  Smoking Status Some Days   Types: Cigarettes  Smokeless Tobacco Current   Types: Snuff   Social History   Substance and Sexual Activity  Alcohol Use Yes   Alcohol/week: 20.0 standard drinks   Types: 20 Cans of beer per week    Family History:  Family History  Problem Relation Age of Onset   Kidney Stones Mother    Diverticulitis Father    Hypertension Father    Hyperlipidemia Father    Allergies Sister    Pancreatic cancer Maternal Grandmother    Lung cancer Maternal Grandfather    Hypertension Paternal Grandfather     Past medical history, surgical history, medications, allergies, family history and social  history reviewed with patient today and changes made to appropriate areas of the chart.   Review of Systems  Eyes:  Negative for blurred vision and double vision.  Respiratory:  Negative for shortness of breath.   Cardiovascular:  Negative for chest pain, palpitations and leg swelling.  Neurological:  Negative for dizziness and headaches.  Psychiatric/Behavioral:  Negative for depression and suicidal ideas. The patient is nervous/anxious.   All other ROS negative except what is listed above and in the HPI.      Objective:    BP 119/69    Pulse 77    Temp 98.3 F (36.8 C) (Oral)    Ht 6' 0.6" (1.844 m)    Wt 164 lb (74.4 kg)    SpO2 98%    BMI 21.88 kg/m   Wt Readings from Last 3 Encounters:  08/13/21 164 lb (74.4 kg)  05/10/21 171 lb (77.6 kg)  02/11/21 155 lb 8 oz (70.5 kg)    Physical Exam Vitals and nursing note reviewed.  Constitutional:      General: He is not in acute distress.    Appearance: Normal appearance. He is not ill-appearing, toxic-appearing or diaphoretic.  HENT:     Head: Normocephalic.     Right Ear: Tympanic membrane, ear canal and external ear normal.     Left Ear: Tympanic membrane, ear canal and external ear normal.     Nose: Nose normal. No congestion or rhinorrhea.     Mouth/Throat:     Mouth: Mucous membranes are moist.  Eyes:     General:        Right eye: No discharge.        Left eye: No discharge.     Extraocular Movements: Extraocular movements intact.     Conjunctiva/sclera: Conjunctivae normal.     Pupils: Pupils are equal, round, and reactive to light.  Cardiovascular:     Rate and Rhythm: Normal rate and regular rhythm.     Heart sounds: No murmur heard. Pulmonary:     Effort: Pulmonary effort is normal. No respiratory distress.     Breath sounds: Normal breath sounds. No wheezing, rhonchi or rales.  Abdominal:     General: Abdomen is flat. Bowel sounds are normal. There is no distension.     Palpations: Abdomen is soft.      Tenderness: There is no abdominal tenderness. There is no guarding.  Musculoskeletal:     Cervical back: Normal range of  motion and neck supple.  Skin:    General: Skin is warm and dry.     Capillary Refill: Capillary refill takes less than 2 seconds.  Neurological:     General: No focal deficit present.     Mental Status: He is alert and oriented to person, place, and time.     Cranial Nerves: No cranial nerve deficit.     Motor: No weakness.     Deep Tendon Reflexes: Reflexes normal.  Psychiatric:        Mood and Affect: Mood normal.        Behavior: Behavior normal.        Thought Content: Thought content normal.        Judgment: Judgment normal.    Results for orders placed or performed during the hospital encounter of 08/19/19  Rapid Strep Screen ([facility] ONLY)   Specimen: Other  Result Value Ref Range   Streptococcus, Group A Screen (Direct) NEGATIVE NEGATIVE  Culture, group A strep   Specimen: Throat  Result Value Ref Range   Specimen Description      THROAT Performed at [facility], [address], [place], [place] [address]    Special Requests      NONE Reflexed from [id number] Performed at [facility], [address], [place], [place] [address]    Culture      NO GROUP A STREP (S.PYOGENES) ISOLATED Performed at [facility], [address] [place], [place] [address]    Report Status 08/21/2019 FINAL   KOH prep   Specimen: Throat  Result Value Ref Range   Specimen Description      THROAT Performed at [facility], [address], [place], [place] [address]    Special Requests      Normal Performed at [facility], [address], [place], [place] [address]    [facility] Prep      BUDDING YEAST SEEN Performed at [facility], [address] [place] [place], [place] [address]    Report Status 08/19/2019 FINAL       Assessment & Plan:   Problem List Items Addressed This Visit       Other    Annual physical exam - Primary    Health maintenance reviewed during visit. Labs ordered during visit. Denies vaccines at visit.      Relevant Orders   TSH   Lipid panel   CBC with Differential/Platelet   Comprehensive metabolic panel   Urinalysis, Routine w reflex microscopic   Anxiety    Chronic.  Controlled.  Continue with current medication regimen Wellbutrin 150mg  daily and Buspar 5mg  PRN.  Labs ordered today.  Return to clinic in 6 months for reevaluation.  Call sooner if concerns arise.        Relevant Medications   buPROPion (WELLBUTRIN XL) 150 MG 24 hr tablet   busPIRone (BUSPAR) 5 MG tablet   hydrOXYzine (ATARAX) 10 MG tablet   Other Visit Diagnoses     Encounter for hepatitis C screening test for low risk patient       Relevant Orders   Hepatitis C Antibody   Screening for HIV (human immunodeficiency virus)       Relevant Orders   HIV Antibody (routine testing w rflx)   Screening for ischemic heart disease       Relevant Orders   Lipid panel        Discussed aspirin prophylaxis for myocardial infarction  prevention and decision was it was not indicated  LABORATORY TESTING:  Health maintenance labs ordered today as discussed above.     IMMUNIZATIONS:   - Tdap: Tetanus vaccination status reviewed: Not up to date - Influenza: Refused - Pneumovax: Not applicable - Prevnar: Not applicable - COVID: Refused - HPV: Refused - Shingrix vaccine: Not applicable  SCREENING: - Colonoscopy: Not applicable  Discussed with patient purpose of the colonoscopy is to detect colon cancer at curable precancerous or early stages   - AAA Screening: Not applicable  -Hearing Test: Not applicable  -Spirometry: Not applicable   PATIENT COUNSELING:    Sexuality: Discussed sexually transmitted diseases, partner selection, use of condoms, avoidance of unintended pregnancy  and contraceptive alternatives.   Advised to avoid cigarette smoking.  I discussed with the patient  that most people either abstain from alcohol or drink within safe limits (<=14/week and <=4 drinks/occasion for males, <=7/weeks and <= 3 drinks/occasion for females) and that the risk for alcohol disorders and other health effects rises proportionally with the number of drinks per week and how often a drinker exceeds daily limits.  Discussed cessation/primary prevention of drug use and availability of treatment for abuse.   Diet: Encouraged to adjust caloric intake to maintain  or achieve ideal body weight, to reduce intake of dietary saturated fat and total fat, to limit sodium intake by avoiding high sodium foods and not adding table salt, and to maintain adequate dietary potassium and calcium preferably from fresh fruits, vegetables, and low-fat dairy products.    stressed the importance of regular exercise  Injury prevention: Discussed safety belts, safety helmets, smoke detector, smoking near bedding or upholstery.   Dental health: Discussed importance of regular tooth brushing, flossing, and dental visits.   Follow up plan: NEXT PREVENTATIVE PHYSICAL DUE IN 1 YEAR. Return in about 6 months (around 02/11/2022) for Depression/Anxiety FU.

## 2021-08-13 ENCOUNTER — Encounter: Payer: Self-pay | Admitting: Nurse Practitioner

## 2021-08-13 ENCOUNTER — Ambulatory Visit: Payer: Managed Care, Other (non HMO) | Admitting: Nurse Practitioner

## 2021-08-13 ENCOUNTER — Other Ambulatory Visit: Payer: Self-pay

## 2021-08-13 VITALS — BP 119/69 | HR 77 | Temp 98.3°F | Ht 72.6 in | Wt 164.0 lb

## 2021-08-13 DIAGNOSIS — Z1159 Encounter for screening for other viral diseases: Secondary | ICD-10-CM | POA: Diagnosis not present

## 2021-08-13 DIAGNOSIS — F419 Anxiety disorder, unspecified: Secondary | ICD-10-CM

## 2021-08-13 DIAGNOSIS — Z114 Encounter for screening for human immunodeficiency virus [HIV]: Secondary | ICD-10-CM

## 2021-08-13 DIAGNOSIS — Z136 Encounter for screening for cardiovascular disorders: Secondary | ICD-10-CM

## 2021-08-13 DIAGNOSIS — Z0000 Encounter for general adult medical examination without abnormal findings: Secondary | ICD-10-CM | POA: Diagnosis not present

## 2021-08-13 MED ORDER — BUSPIRONE HCL 5 MG PO TABS: 5.0000 mg | ORAL_TABLET | Freq: Three times a day (TID) | ORAL | 1 refills | Status: DC

## 2021-08-13 MED ORDER — BUPROPION HCL ER (XL) 150 MG PO TB24: 150.0000 mg | ORAL_TABLET | Freq: Every day | ORAL | 1 refills | Status: DC

## 2021-08-13 MED ORDER — HYDROXYZINE HCL 10 MG PO TABS: 10.0000 mg | ORAL_TABLET | Freq: Three times a day (TID) | ORAL | 1 refills | Status: DC | PRN

## 2021-08-13 NOTE — Assessment & Plan Note (Signed)
Health maintenance reviewed during visit. Labs ordered during visit. Denies vaccines at visit.

## 2021-08-13 NOTE — Assessment & Plan Note (Signed)
Chronic.  Controlled.  Continue with current medication regimen Wellbutrin 150mg  daily and Buspar 5mg  PRN.  Labs ordered today.  Return to clinic in 6 months for reevaluation.  Call sooner if concerns arise.

## 2021-08-14 LAB — CBC WITH DIFFERENTIAL/PLATELET
Basophils Absolute: 0 10*3/uL (ref 0.0–0.2)
Basos: 1 %
EOS (ABSOLUTE): 0.2 10*3/uL (ref 0.0–0.4)
Eos: 5 %
Hematocrit: 47.8 % (ref 37.5–51.0)
Hemoglobin: 16.2 g/dL (ref 13.0–17.7)
Immature Grans (Abs): 0 10*3/uL (ref 0.0–0.1)
Immature Granulocytes: 0 %
Lymphocytes Absolute: 1.4 10*3/uL (ref 0.7–3.1)
Lymphs: 32 %
MCH: 29.6 pg (ref 26.6–33.0)
MCHC: 33.9 g/dL (ref 31.5–35.7)
MCV: 87 fL (ref 79–97)
Monocytes Absolute: 0.4 10*3/uL (ref 0.1–0.9)
Monocytes: 9 %
Neutrophils Absolute: 2.4 10*3/uL (ref 1.4–7.0)
Neutrophils: 53 %
Platelets: 307 10*3/uL (ref 150–450)
RBC: 5.48 x10E6/uL (ref 4.14–5.80)
RDW: 12.6 % (ref 11.6–15.4)
WBC: 4.5 10*3/uL (ref 3.4–10.8)

## 2021-08-14 LAB — COMPREHENSIVE METABOLIC PANEL
ALT: 45 IU/L — ABNORMAL HIGH (ref 0–44)
AST: 27 IU/L (ref 0–40)
Albumin/Globulin Ratio: 2.3 — ABNORMAL HIGH (ref 1.2–2.2)
Albumin: 5 g/dL (ref 4.0–5.0)
Alkaline Phosphatase: 110 IU/L (ref 44–121)
BUN/Creatinine Ratio: 10 (ref 9–20)
BUN: 11 mg/dL (ref 6–20)
Bilirubin Total: 0.3 mg/dL (ref 0.0–1.2)
CO2: 25 mmol/L (ref 20–29)
Calcium: 9.8 mg/dL (ref 8.7–10.2)
Chloride: 98 mmol/L (ref 96–106)
Creatinine, Ser: 1.08 mg/dL (ref 0.76–1.27)
Globulin, Total: 2.2 g/dL (ref 1.5–4.5)
Glucose: 105 mg/dL — ABNORMAL HIGH (ref 70–99)
Potassium: 4.1 mmol/L (ref 3.5–5.2)
Sodium: 139 mmol/L (ref 134–144)
Total Protein: 7.2 g/dL (ref 6.0–8.5)
eGFR: 93 mL/min/{1.73_m2} (ref >59)

## 2021-08-14 LAB — LIPID PANEL
Chol/HDL Ratio: 3.7 ratio (ref 0.0–5.0)
Cholesterol, Total: 158 mg/dL (ref 100–199)
HDL: 43 mg/dL (ref >39)
LDL Chol Calc (NIH): 92 mg/dL (ref 0–99)
Triglycerides: 126 mg/dL (ref 0–149)
VLDL Cholesterol Cal: 23 mg/dL (ref 5–40)

## 2021-08-14 LAB — TSH: TSH: 1.09 u[IU]/mL (ref 0.450–4.500)

## 2021-08-14 LAB — HEPATITIS C ANTIBODY: Hep C Virus Ab: <0.1 s/co ratio (ref 0.0–0.9)

## 2021-08-14 LAB — HIV ANTIBODY (ROUTINE TESTING W REFLEX): HIV Screen 4th Generation wRfx: NONREACTIVE

## 2021-08-16 NOTE — Progress Notes (Signed)
Hi Joshua Davies.  Your lab work looks great.  Thyroid is within normal range. Cholesterol looks great. Complete blood count is normal.  Your glucose is a little bit elevated but not concerning at this point.  Your ALT which is one of the liver enzymes is improved from prior.  Please let me know if you have any questions.

## 2021-09-12 ENCOUNTER — Other Ambulatory Visit: Payer: Self-pay | Admitting: Nurse Practitioner

## 2021-09-12 NOTE — Telephone Encounter (Signed)
Requested Prescriptions  Pending Prescriptions Disp Refills   fluticasone (FLONASE) 50 MCG/ACT nasal spray [Pharmacy Med Name: FLUTICASONE PROP 50 MCG SPRAY] 48 mL 0    Sig: PLACE 1 SPRAY INTO BOTH NOSTRILS IN THE MORNING AND AT BEDTIME.     Ear, Nose, and Throat: Nasal Preparations - Corticosteroids Passed - 09/12/2021  9:30 AM      Passed - Valid encounter within last 12 months    Recent Outpatient Visits          1 month ago Annual physical exam   [facility] Larae Grooms, NP   7 months ago Anxiety   [facility] Larae Grooms, NP   8 months ago Anxiety   [facility] McElwee, Jake Church, NP   9 months ago Anxiety   [facility] Larae Grooms, NP   9 months ago Anxiety   [facility] Larae Grooms, NP      Future Appointments            In 5 months Larae Grooms, NP [facility], [facility]

## 2021-11-26 ENCOUNTER — Other Ambulatory Visit: Payer: Self-pay | Admitting: Gastroenterology

## 2021-12-01 MED ORDER — PANTOPRAZOLE SODIUM 40 MG PO TBEC: 40.0000 mg | DELAYED_RELEASE_TABLET | Freq: Every day | ORAL | 0 refills | Status: DC

## 2021-12-01 NOTE — Addendum Note (Signed)
Addended by: Lurlean Nanny on: 12/01/2021 03:53 PM ? ? Modules accepted: Orders ? ?

## 2021-12-27 ENCOUNTER — Other Ambulatory Visit: Payer: Self-pay | Admitting: Nurse Practitioner

## 2021-12-28 NOTE — Telephone Encounter (Signed)
Requested Prescriptions  ?Pending Prescriptions Disp Refills  ?? montelukast (SINGULAIR) 10 MG tablet [Pharmacy Med Name: MONTELUKAST SOD 10 MG TABLET] 90 tablet 0  ?  Sig: TAKE 1 TABLET BY MOUTH EVERYDAY AT BEDTIME  ?  ? Pulmonology:  Leukotriene Inhibitors Passed - 12/27/2021  2:31 AM  ?  ?  Passed - Valid encounter within last 12 months  ?  Recent Outpatient Visits   ?      ? 4 months ago Annual physical exam  ? [facility] Larae Grooms, NP  ? 10 months ago Anxiety  ? [facility] Larae Grooms, NP  ? 12 months ago Anxiety  ? [facility] McElwee, Lauren A, NP  ? 1 year ago Anxiety  ? [facility] Larae Grooms, NP  ? 1 year ago Anxiety  ? [facility] Larae Grooms, NP  ?  ?  ?Future Appointments   ?        ? In 1 month Larae Grooms, NP [facility], [facility]  ?  ? ?  ?  ?  ? ? ?

## 2022-02-11 ENCOUNTER — Ambulatory Visit: Payer: Managed Care, Other (non HMO) | Admitting: Nurse Practitioner

## 2022-02-24 ENCOUNTER — Encounter: Payer: Self-pay | Admitting: Nurse Practitioner

## 2022-02-24 ENCOUNTER — Ambulatory Visit: Payer: Managed Care, Other (non HMO) | Admitting: Nurse Practitioner

## 2022-02-24 VITALS — BP 137/87 | HR 78 | Temp 97.9°F | Wt 165.2 lb

## 2022-02-24 DIAGNOSIS — F419 Anxiety disorder, unspecified: Secondary | ICD-10-CM

## 2022-02-24 MED ORDER — BUPROPION HCL ER (XL) 150 MG PO TB24: 150.0000 mg | ORAL_TABLET | Freq: Every day | ORAL | 1 refills | Status: DC

## 2022-02-24 MED ORDER — BUSPIRONE HCL 10 MG PO TABS: 10.0000 mg | ORAL_TABLET | Freq: Three times a day (TID) | ORAL | 1 refills | Status: DC

## 2022-02-24 MED ORDER — HYDROXYZINE HCL 10 MG PO TABS: 10.0000 mg | ORAL_TABLET | Freq: Three times a day (TID) | ORAL | 1 refills | Status: DC | PRN

## 2022-02-24 MED ORDER — LORAZEPAM 0.5 MG PO TABS: 0.5000 mg | ORAL_TABLET | Freq: Every day | ORAL | 0 refills | Status: DC | PRN

## 2022-02-24 NOTE — Assessment & Plan Note (Signed)
Chronic. Exacerbated from prior.  Patient is experiencing some personal difficulties which has made his anxiety worse.  Will continue the Wellbutrin 150mg  daily.  Will increase the Buspar from 5mg  to 10mg  daily.  Will give Lorazepam 0.5mg  10 tabs.  Ideally would last for 3 months.  However, given acute concerns may need a sooner refill.  Will follow up in 3 months for reevaluation.  Call sooner if concerns arise.

## 2022-02-24 NOTE — Progress Notes (Signed)
BP 137/87   Pulse 78   Temp 97.9 F (36.6 C) (Oral)   Wt 165 lb 3.2 oz (74.9 kg)   SpO2 98%   BMI 22.04 kg/m    Subjective:    Patient ID: Joshua Davies, male    DOB: November 11, 1985, 36 y.o.   MRN: [id number]  HPI: Joshua Davies is a 36 y.o. male  Chief Complaint  Patient presents with   Depression    Follow up    [place] Patient states his panic attacks have been very minimal.  He is going through some personal stuff that is worsening his anxiety.  He is having some anxiety attacks where they build on each other.  His anxiety is more sporadic and not all the time.       02/24/2022    2:48 PM 08/13/2021    8:17 AM 02/11/2021    9:38 AM 01/01/2021   10:44 AM  GAD 7 : Generalized Anxiety Score  Nervous, Anxious, on Edge 2 2 0 1  Control/stop worrying 0 2 0 0  Worry too much - different things 0 1 0 0  Trouble relaxing 2 2 0 1  Restless 1 2 0 1  Easily annoyed or irritable 3 3 0 3  Afraid - awful might happen 0 3 0 0  Total GAD 7 Score 8 15 0 6  Anxiety Difficulty Somewhat difficult Somewhat difficult Not difficult at all Not difficult at all     Relevant past medical, surgical, family and social history reviewed and updated as indicated. Interim medical history since our last visit reviewed. Allergies and medications reviewed and updated.  Review of Systems  Psychiatric/Behavioral:  Negative for dysphoric mood, sleep disturbance and suicidal ideas. The patient is nervous/anxious.     Per HPI unless specifically indicated above     Objective:    BP 137/87   Pulse 78   Temp 97.9 F (36.6 C) (Oral)   Wt 165 lb 3.2 oz (74.9 kg)   SpO2 98%   BMI 22.04 kg/m   Wt Readings from Last 3 Encounters:  02/24/22 165 lb 3.2 oz (74.9 kg)  08/13/21 164 lb (74.4 kg)  05/10/21 171 lb (77.6 kg)    Physical Exam Vitals and nursing note reviewed.  Constitutional:      General: He is not in acute distress.    Appearance: Normal appearance. He is not  ill-appearing, toxic-appearing or diaphoretic.  HENT:     Head: Normocephalic.     Right Ear: External ear normal.     Left Ear: External ear normal.     Nose: Nose normal. No congestion or rhinorrhea.     Mouth/Throat:     Mouth: Mucous membranes are moist.  Eyes:     General:        Right eye: No discharge.        Left eye: No discharge.     Extraocular Movements: Extraocular movements intact.     Conjunctiva/sclera: Conjunctivae normal.     Pupils: Pupils are equal, round, and reactive to light.  Cardiovascular:     Rate and Rhythm: Normal rate and regular rhythm.     Heart sounds: No murmur heard. Pulmonary:     Effort: Pulmonary effort is normal. No respiratory distress.     Breath sounds: Normal breath sounds. No wheezing, rhonchi or rales.  Abdominal:     General: Abdomen is flat. Bowel sounds are normal.  Musculoskeletal:  Cervical back: Normal range of motion and neck supple.  Skin:    General: Skin is warm and dry.     Capillary Refill: Capillary refill takes less than 2 seconds.  Neurological:     General: No focal deficit present.     Mental Status: He is alert and oriented to person, place, and time.  Psychiatric:        Mood and Affect: Mood normal.        Behavior: Behavior normal.        Thought Content: Thought content normal.        Judgment: Judgment normal.     Results for orders placed or performed in visit on 08/13/21  TSH  Result Value Ref Range   TSH 1.090 0.450 - 4.500 uIU/mL  Lipid panel  Result Value Ref Range   Cholesterol, Total 158 100 - 199 mg/dL   Triglycerides 126 0 - 149 mg/dL   HDL 43 >39 mg/dL   VLDL Cholesterol Cal 23 5 - 40 mg/dL   LDL Chol Calc (NIH) 92 0 - 99 mg/dL   Chol/HDL Ratio 3.7 0.0 - 5.0 ratio  CBC with Differential/Platelet  Result Value Ref Range   WBC 4.5 3.4 - 10.8 x10E3/uL   RBC 5.48 4.14 - 5.80 x10E6/uL   Hemoglobin 16.2 13.0 - 17.7 g/dL   Hematocrit 47.8 37.5 - 51.0 %   MCV 87 79 - 97 fL   MCH 29.6  26.6 - 33.0 pg   MCHC 33.9 31.5 - 35.7 g/dL   RDW 12.6 11.6 - 15.4 %   Platelets 307 150 - 450 x10E3/uL   Neutrophils 53 Not Estab. %   Lymphs 32 Not Estab. %   Monocytes 9 Not Estab. %   Eos 5 Not Estab. %   Basos 1 Not Estab. %   Neutrophils Absolute 2.4 1.4 - 7.0 x10E3/uL   Lymphocytes Absolute 1.4 0.7 - 3.1 x10E3/uL   Monocytes Absolute 0.4 0.1 - 0.9 x10E3/uL   EOS (ABSOLUTE) 0.2 0.0 - 0.4 x10E3/uL   Basophils Absolute 0.0 0.0 - 0.2 x10E3/uL   Immature Granulocytes 0 Not Estab. %   Immature Grans (Abs) 0.0 0.0 - 0.1 x10E3/uL  Comprehensive metabolic panel  Result Value Ref Range   Glucose 105 (H) 70 - 99 mg/dL   BUN 11 6 - 20 mg/dL   Creatinine, Ser 1.08 0.76 - 1.27 mg/dL   eGFR 93 >59 mL/min/1.73   BUN/Creatinine Ratio 10 9 - 20   Sodium 139 134 - 144 mmol/L   Potassium 4.1 3.5 - 5.2 mmol/L   Chloride 98 96 - 106 mmol/L   CO2 25 20 - 29 mmol/L   Calcium 9.8 8.7 - 10.2 mg/dL   Total Protein 7.2 6.0 - 8.5 g/dL   Albumin 5.0 4.0 - 5.0 g/dL   Globulin, Total 2.2 1.5 - 4.5 g/dL   Albumin/Globulin Ratio 2.3 (H) 1.2 - 2.2   Bilirubin Total 0.3 0.0 - 1.2 mg/dL   Alkaline Phosphatase 110 44 - 121 IU/L   AST 27 0 - 40 IU/L   ALT 45 (H) 0 - 44 IU/L  Hepatitis C Antibody  Result Value Ref Range   Hep C Virus Ab <0.1 0.0 - 0.9 s/co ratio  HIV Antibody (routine testing w rflx)  Result Value Ref Range   HIV Screen 4th Generation wRfx Non Reactive Non Reactive      Assessment & Plan:   Problem List Items Addressed This Visit  Other   Anxiety - Primary     Follow up plan: No follow-ups on file.

## 2022-04-21 ENCOUNTER — Other Ambulatory Visit: Payer: Self-pay | Admitting: Nurse Practitioner

## 2022-04-21 DIAGNOSIS — F419 Anxiety disorder, unspecified: Secondary | ICD-10-CM

## 2022-04-21 NOTE — Telephone Encounter (Signed)
Requested by interface surescripts. Last refill 02/24/22 #270 requesting too soon.  Requested Prescriptions  Refused Prescriptions Disp Refills  . busPIRone (BUSPAR) 5 MG tablet [Pharmacy Med Name: BUSPIRONE HCL 5 MG TABLET] 270 tablet 1    Sig: TAKE 1 TABLET BY MOUTH THREE TIMES A DAY     Psychiatry: Anxiolytics/Hypnotics - Non-controlled Passed - 04/21/2022  1:58 AM      Passed - Valid encounter within last 12 months    Recent Outpatient Visits          1 month ago Anxiety   [facility] Larae Grooms, NP   8 months ago Annual physical exam   [facility] Larae Grooms, NP   1 year ago Anxiety   [facility] Larae Grooms, NP   1 year ago Anxiety   [facility] McElwee, Jake Church, NP   1 year ago Anxiety   [facility] Larae Grooms, NP      Future Appointments            In 1 month Larae Grooms, NP [facility], [facility]

## 2022-05-26 NOTE — Progress Notes (Signed)
BP 125/88   Pulse 72   Temp 98.3 F (36.8 C) (Oral)   Wt 163 lb 6.4 oz (74.1 kg)   SpO2 97%   BMI 21.80 kg/m    Subjective:    Patient ID: Joshua Davies, male    DOB: [birth year], 36 y.o.   MRN: [id number]  HPI: Joshua Davies is a 36 y.o. male  Chief Complaint  Patient presents with   Anxiety   Depression    3 month follow up - pt anxiety is doing well.    ANXIETY Patient states he is having less panic attacks.  Once in a while has them.  Has not ben using the lorazepam.  Feels like his medications are working well.  Denies concerns at visit today.     05/27/2022    9:01 AM 02/24/2022    2:48 PM 08/13/2021    8:17 AM 02/11/2021    9:38 AM  GAD 7 : Generalized Anxiety Score  Nervous, Anxious, on Edge '1 2 2 ' 0  Control/stop worrying 0 0 2 0  Worry too much - different things 0 0 1 0  Trouble relaxing '1 2 2 ' 0  Restless '1 1 2 ' 0  Easily annoyed or irritable '1 3 3 ' 0  Afraid - awful might happen 0 0 3 0  Total GAD 7 Score '4 8 15 ' 0  Anxiety Difficulty Somewhat difficult Somewhat difficult Somewhat difficult Not difficult at all     Relevant past medical, surgical, family and social history reviewed and updated as indicated. Interim medical history since our last visit reviewed. Allergies and medications reviewed and updated.  Review of Systems  Psychiatric/Behavioral:  Negative for dysphoric mood, sleep disturbance and suicidal ideas. The patient is nervous/anxious.     Per HPI unless specifically indicated above     Objective:    BP 125/88   Pulse 72   Temp 98.3 F (36.8 C) (Oral)   Wt 163 lb 6.4 oz (74.1 kg)   SpO2 97%   BMI 21.80 kg/m   Wt Readings from Last 3 Encounters:  05/27/22 163 lb 6.4 oz (74.1 kg)  02/24/22 165 lb 3.2 oz (74.9 kg)  08/13/21 164 lb (74.4 kg)    Physical Exam Vitals and nursing note reviewed.  Constitutional:      General: He is not in acute distress.    Appearance: Normal appearance. He is not ill-appearing,  toxic-appearing or diaphoretic.  HENT:     Head: Normocephalic.     Right Ear: External ear normal.     Left Ear: External ear normal.     Nose: Nose normal. No congestion or rhinorrhea.     Mouth/Throat:     Mouth: Mucous membranes are moist.  Eyes:     General:        Right eye: No discharge.        Left eye: No discharge.     Extraocular Movements: Extraocular movements intact.     Conjunctiva/sclera: Conjunctivae normal.     Pupils: Pupils are equal, round, and reactive to light.  Cardiovascular:     Rate and Rhythm: Normal rate and regular rhythm.     Heart sounds: No murmur heard. Pulmonary:     Effort: Pulmonary effort is normal. No respiratory distress.     Breath sounds: Normal breath sounds. No wheezing, rhonchi or rales.  Abdominal:     General: Abdomen is flat. Bowel sounds are normal.  Musculoskeletal:     Cervical back: Normal  range of motion and neck supple.  Skin:    General: Skin is warm and dry.     Capillary Refill: Capillary refill takes less than 2 seconds.  Neurological:     General: No focal deficit present.     Mental Status: He is alert and oriented to person, place, and time.  Psychiatric:        Mood and Affect: Mood normal.        Behavior: Behavior normal.        Thought Content: Thought content normal.        Judgment: Judgment normal.     Results for orders placed or performed in visit on 08/13/21  TSH  Result Value Ref Range   TSH 1.090 0.450 - 4.500 uIU/mL  Lipid panel  Result Value Ref Range   Cholesterol, Total 158 100 - 199 mg/dL   Triglycerides 126 0 - 149 mg/dL   HDL 43 >39 mg/dL   VLDL Cholesterol Cal 23 5 - 40 mg/dL   LDL Chol Calc (NIH) 92 0 - 99 mg/dL   Chol/HDL Ratio 3.7 0.0 - 5.0 ratio  CBC with Differential/Platelet  Result Value Ref Range   WBC 4.5 3.4 - 10.8 x10E3/uL   RBC 5.48 4.14 - 5.80 x10E6/uL   Hemoglobin 16.2 13.0 - 17.7 g/dL   Hematocrit 47.8 37.5 - 51.0 %   MCV 87 79 - 97 fL   MCH 29.6 26.6 - 33.0 pg    MCHC 33.9 31.5 - 35.7 g/dL   RDW 12.6 11.6 - 15.4 %   Platelets 307 150 - 450 x10E3/uL   Neutrophils 53 Not Estab. %   Lymphs 32 Not Estab. %   Monocytes 9 Not Estab. %   Eos 5 Not Estab. %   Basos 1 Not Estab. %   Neutrophils Absolute 2.4 1.4 - 7.0 x10E3/uL   Lymphocytes Absolute 1.4 0.7 - 3.1 x10E3/uL   Monocytes Absolute 0.4 0.1 - 0.9 x10E3/uL   EOS (ABSOLUTE) 0.2 0.0 - 0.4 x10E3/uL   Basophils Absolute 0.0 0.0 - 0.2 x10E3/uL   Immature Granulocytes 0 Not Estab. %   Immature Grans (Abs) 0.0 0.0 - 0.1 x10E3/uL  Comprehensive metabolic panel  Result Value Ref Range   Glucose 105 (H) 70 - 99 mg/dL   BUN 11 6 - 20 mg/dL   Creatinine, Ser 1.08 0.76 - 1.27 mg/dL   eGFR 93 >59 mL/min/1.73   BUN/Creatinine Ratio 10 9 - 20   Sodium 139 134 - 144 mmol/L   Potassium 4.1 3.5 - 5.2 mmol/L   Chloride 98 96 - 106 mmol/L   CO2 25 20 - 29 mmol/L   Calcium 9.8 8.7 - 10.2 mg/dL   Total Protein 7.2 6.0 - 8.5 g/dL   Albumin 5.0 4.0 - 5.0 g/dL   Globulin, Total 2.2 1.5 - 4.5 g/dL   Albumin/Globulin Ratio 2.3 (H) 1.2 - 2.2   Bilirubin Total 0.3 0.0 - 1.2 mg/dL   Alkaline Phosphatase 110 44 - 121 IU/L   AST 27 0 - 40 IU/L   ALT 45 (H) 0 - 44 IU/L  Hepatitis C Antibody  Result Value Ref Range   Hep C Virus Ab <0.1 0.0 - 0.9 s/co ratio  HIV Antibody (routine testing w rflx)  Result Value Ref Range   HIV Screen 4th Generation wRfx Non Reactive Non Reactive      Assessment & Plan:   Problem List Items Addressed This Visit       Other  Anxiety - Primary    Chronic. Improved.  Will continue the Wellbutrin 136m daily.  Will continue the Buspar 159mdaily.  Will follow up in 6 months for reevaluation.  Call sooner if concerns arise.       Relevant Medications   buPROPion (WELLBUTRIN XL) 150 MG 24 hr tablet   busPIRone (BUSPAR) 10 MG tablet   hydrOXYzine (ATARAX) 10 MG tablet     Follow up plan: Return in about 6 months (around 11/25/2022) for Depression/Anxiety FU.

## 2022-05-27 ENCOUNTER — Encounter: Payer: Self-pay | Admitting: Nurse Practitioner

## 2022-05-27 ENCOUNTER — Ambulatory Visit: Payer: Managed Care, Other (non HMO) | Admitting: Nurse Practitioner

## 2022-05-27 VITALS — BP 125/88 | HR 72 | Temp 98.3°F | Wt 163.4 lb

## 2022-05-27 DIAGNOSIS — F419 Anxiety disorder, unspecified: Secondary | ICD-10-CM

## 2022-05-27 MED ORDER — BUPROPION HCL ER (XL) 150 MG PO TB24: 150.0000 mg | ORAL_TABLET | Freq: Every day | ORAL | 1 refills | Status: DC

## 2022-05-27 MED ORDER — HYDROXYZINE HCL 10 MG PO TABS: 10.0000 mg | ORAL_TABLET | Freq: Three times a day (TID) | ORAL | 1 refills | Status: DC | PRN

## 2022-05-27 MED ORDER — MONTELUKAST SODIUM 10 MG PO TABS
ORAL_TABLET | ORAL | 1 refills | Status: AC
Start: 2022-05-27 — End: ?

## 2022-05-27 MED ORDER — BUSPIRONE HCL 10 MG PO TABS: 10.0000 mg | ORAL_TABLET | Freq: Three times a day (TID) | ORAL | 1 refills | Status: DC

## 2022-05-27 NOTE — Assessment & Plan Note (Signed)
Chronic. Improved.  Will continue the Wellbutrin 150mg  daily.  Will continue the Buspar 10mg  daily.  Will follow up in 6 months for reevaluation.  Call sooner if concerns arise.

## 2022-06-15 ENCOUNTER — Other Ambulatory Visit: Payer: Self-pay | Admitting: Gastroenterology

## 2022-09-14 ENCOUNTER — Other Ambulatory Visit: Payer: Self-pay | Admitting: Gastroenterology

## 2022-10-19 ENCOUNTER — Other Ambulatory Visit: Payer: Self-pay | Admitting: Gastroenterology

## 2022-10-23 ENCOUNTER — Other Ambulatory Visit: Payer: Self-pay | Admitting: Gastroenterology

## 2022-11-01 ENCOUNTER — Other Ambulatory Visit: Payer: Self-pay

## 2022-11-01 ENCOUNTER — Telehealth: Payer: Self-pay | Admitting: Gastroenterology

## 2022-11-01 MED ORDER — PANTOPRAZOLE SODIUM 40 MG PO TBEC: 40.0000 mg | DELAYED_RELEASE_TABLET | Freq: Every day | ORAL | 0 refills | Status: DC

## 2022-11-01 MED ORDER — PANTOPRAZOLE SODIUM 40 MG PO TBEC: 40.0000 mg | DELAYED_RELEASE_TABLET | Freq: Every day | ORAL | 1 refills | Status: DC

## 2022-11-01 NOTE — Telephone Encounter (Signed)
Called patient to let him know that his Pantoprazole was refilled for 2 months since he has an appointment scheduled with Dr. Allen Norris on 12/29/2022. They will discuss if patient should continue taking a PPI or not. Patient understands.

## 2022-11-01 NOTE — Addendum Note (Signed)
Addended by: Wayna Chalet on: 11/01/2022 11:11 AM   Modules accepted: Orders

## 2022-11-01 NOTE — Telephone Encounter (Signed)
Patient needs refill on pantoprazole. I scheduled the patient for an upcoming appointment with Dr. Allen Norris for an annual visit. Patient states he hasn't been seen or called because this prescription does well for him.

## 2022-11-10 ENCOUNTER — Telehealth: Payer: Managed Care, Other (non HMO) | Admitting: Physician Assistant

## 2022-11-10 DIAGNOSIS — L731 Pseudofolliculitis barbae: Secondary | ICD-10-CM

## 2022-11-10 MED ORDER — DOXYCYCLINE HYCLATE 100 MG PO TABS: 100.0000 mg | ORAL_TABLET | Freq: Two times a day (BID) | ORAL | 0 refills | Status: DC

## 2022-11-10 NOTE — Patient Instructions (Signed)
  Marin Olp, thank you for joining [name], PA-C for today's virtual visit.  While this provider is not your primary care provider (PCP), if your PCP is located in our provider database this encounter information will be shared with them immediately following your visit.   [facility] account gives you access to today's visit and all your visits, tests, and labs performed at [facility] " click here if you don't have a [facility] account or go to [email or web address]  Consent: (Patient) Joshua Davies provided verbal consent for this virtual visit at the beginning of the encounter.  Current Medications:  Current Outpatient Medications:    buPROPion (WELLBUTRIN XL) 150 MG 24 hr tablet, Take 1 tablet (150 mg total) by mouth daily., Disp: 90 tablet, Rfl: 1   busPIRone (BUSPAR) 10 MG tablet, Take 1 tablet (10 mg total) by mouth 3 (three) times daily., Disp: 270 tablet, Rfl: 1   fluticasone (FLONASE) 50 MCG/ACT nasal spray, PLACE 1 SPRAY INTO BOTH NOSTRILS IN THE MORNING AND AT BEDTIME., Disp: 48 mL, Rfl: 0   hydrOXYzine (ATARAX) 10 MG tablet, Take 1 tablet (10 mg total) by mouth 3 (three) times daily as needed., Disp: 270 tablet, Rfl: 1   montelukast (SINGULAIR) 10 MG tablet, TAKE 1 TABLET BY MOUTH EVERYDAY AT BEDTIME, Disp: 90 tablet, Rfl: 1   pantoprazole (PROTONIX) 40 MG tablet, Take 1 tablet (40 mg total) by mouth daily before breakfast. MUST SCHEDULE OFFICE VISIT, Disp: 30 tablet, Rfl: 1   Medications ordered in this encounter:  No orders of the defined types were placed in this encounter.    *If you need refills on other medications prior to your next appointment, please contact your pharmacy*  Follow-Up: Call back or seek an in-person evaluation if the symptoms worsen or if the condition fails to improve as anticipated.  [place] [phone]  Other Instructions Keep skin clean and dry. Consider applying witch  hazel astringent to the area.  Wear loose-fitting clothing when possible. Take antibiotic as directed. Follow-up in person with your PCP if symptoms are not resolving or if anything worsens despite treatment.    If you have been instructed to have an in-person evaluation today at a local Urgent Care facility, please use the link below. It will take you to a list of all of our available [facility], including address, phone number and hours of operation. Please do not delay care.  [facility]  If you or a family member do not have a primary care provider, use the link below to schedule a visit and establish care. When you choose a [facility] primary care physician or advanced practice provider, you gain a long-term partner in health. Find a Primary Care Provider  Learn more about [facility] in-office and virtual care options: [facility]

## 2022-11-10 NOTE — Progress Notes (Signed)
Virtual Visit Consent   Joshua Davies, you are scheduled for a virtual visit with a [facility] provider today. Just as with appointments in the office, your consent must be obtained to participate. Your consent will be active for this visit and any virtual visit you may have with one of our providers in the next 365 days. If you have a MyChart account, a copy of this consent can be sent to you electronically.  As this is a virtual visit, video technology does not allow for your provider to perform a traditional examination. This may limit your provider's ability to fully assess your condition. If your provider identifies any concerns that need to be evaluated in person or the need to arrange testing (such as labs, EKG, etc.), we will make arrangements to do so. Although advances in technology are sophisticated, we cannot ensure that it will always work on either your end or our end. If the connection with a video visit is poor, the visit may have to be switched to a telephone visit. With either a video or telephone visit, we are not always able to ensure that we have a secure connection.  By engaging in this virtual visit, you consent to the provision of healthcare and authorize for your insurance to be billed (if applicable) for the services provided during this visit. Depending on your insurance coverage, you may receive a charge related to this service.  I need to obtain your verbal consent now. Are you willing to proceed with your visit today? LEHI CORNS has provided verbal consent on 11/10/2022 for a virtual visit (video or telephone). Joshua Davies, [place]  Date: 11/10/2022 3:54 PM  Virtual Visit via Video Note   I, Joshua Davies, connected with  QUINNTEN BITER  ([id number], [birth year]) on 11/10/22 at  3:45 PM EDT by a video-enabled telemedicine application and verified that I am speaking with the correct person using two identifiers.  Location: Patient: Virtual Visit  Location Patient: Mobile Provider: Virtual Visit Location Provider: Home Office   I discussed the limitations of evaluation and management by telemedicine and the availability of in person appointments. The patient expressed understanding and agreed to proceed.    History of Present Illness: Joshua Davies is a 37 y.o. who identifies as a male who was assigned male at birth, and is being seen today for infected ingrown hair around his belt line, first noticed in the past couple of days. 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 Tried to keep clean and dry. No drainage from area but now tenderness and redness of surrounding skin. Denies ever, chills. Has had this before and had to be treated with antibiotics.   HPI: HPI  Problems:  Patient Active Problem List   Diagnosis Date Noted   Annual physical exam 11/03/2020   Anxiety [id number]   Eosinophilic esophagitis [id number]   Allergic rhinitis 05/23/2018   Insomnia 05/23/2018    Allergies:  Allergies  Allergen Reactions   Gluten Meal    Wheat    Medications:  Current Outpatient Medications:    doxycycline (VIBRA-TABS) 100 MG tablet, Take 1 tablet (100 mg total) by mouth 2 (two) times daily., Disp: 14 tablet, Rfl: 0   buPROPion (WELLBUTRIN XL) 150 MG 24 hr tablet, Take 1 tablet (150 mg total) by mouth daily., Disp: 90 tablet, Rfl: 1   busPIRone (BUSPAR) 10 MG tablet, Take 1 tablet (10 mg total) by mouth 3 (  three) times daily., Disp: 270 tablet, Rfl: 1   fluticasone (FLONASE) 50 MCG/ACT nasal spray, PLACE 1 SPRAY INTO BOTH NOSTRILS IN THE MORNING AND AT BEDTIME., Disp: 48 mL, Rfl: 0   hydrOXYzine (ATARAX) 10 MG tablet, Take 1 tablet (10 mg total) by mouth 3 (three) times daily as needed., Disp: 270 tablet, Rfl: 1   montelukast (SINGULAIR) 10 MG tablet, TAKE 1 TABLET BY MOUTH EVERYDAY AT BEDTIME, Disp: 90 tablet, Rfl: 1   pantoprazole (PROTONIX) 40 MG tablet, Take 1 tablet (40 mg total) by mouth daily  before breakfast. MUST SCHEDULE OFFICE VISIT, Disp: 30 tablet, Rfl: 1  Observations/Objective: Patient is well-developed, well-nourished in no acute distress.  Resting comfortably at home.  Head is normocephalic, atraumatic.  No labored breathing. Speech is clear and coherent with logical content.  Patient is alert and oriented at baseline.  Unable to visualize area giving patient location.   Assessment and Plan: 1. Ingrown hair - doxycycline (VIBRA-TABS) 100 MG tablet; Take 1 tablet (100 mg total) by mouth 2 (two) times daily.  Dispense: 14 tablet; Refill: 0  With concern for infection. Supportive measures and skin care reviewed. Start Doxycycline per orders.   Follow Up Instructions: I discussed the assessment and treatment plan with the patient. The patient was provided an opportunity to ask questions and all were answered. The patient agreed with the plan and demonstrated an understanding of the instructions.  A copy of instructions were sent to the patient via MyChart unless otherwise noted below.   The patient was advised to call back or seek an in-person evaluation if the symptoms worsen or if the condition fails to improve as anticipated.  Time:  I spent 10 minutes with the patient via telehealth technology discussing the above problems/concerns.    Joshua Rio, PA-C

## 2022-11-23 ENCOUNTER — Other Ambulatory Visit: Payer: Self-pay | Admitting: Gastroenterology

## 2022-11-25 ENCOUNTER — Ambulatory Visit: Payer: Managed Care, Other (non HMO) | Admitting: Nurse Practitioner

## 2022-12-29 ENCOUNTER — Ambulatory Visit: Payer: Managed Care, Other (non HMO) | Admitting: Gastroenterology

## 2023-01-09 ENCOUNTER — Encounter: Payer: Self-pay | Admitting: Gastroenterology

## 2023-01-09 ENCOUNTER — Encounter: Payer: Self-pay | Admitting: Nurse Practitioner

## 2023-01-09 ENCOUNTER — Ambulatory Visit: Payer: Managed Care, Other (non HMO) | Admitting: Gastroenterology

## 2023-01-09 VITALS — BP 132/96 | HR 84 | Temp 98.4°F | Wt 168.0 lb

## 2023-01-09 DIAGNOSIS — K219 Gastro-esophageal reflux disease without esophagitis: Secondary | ICD-10-CM

## 2023-01-09 MED ORDER — PANTOPRAZOLE SODIUM 40 MG PO TBEC: 40.0000 mg | DELAYED_RELEASE_TABLET | Freq: Every day | ORAL | 3 refills | Status: DC

## 2023-01-09 NOTE — Progress Notes (Signed)
Primary Care Physician: Larae Grooms, NP  Primary Gastroenterologist:  Dr. Midge Minium  Chief Complaint  Patient presents with   Follow-up   Medication Refill    Pt reports he has been taking Rx daily, does notice a difference in Sx if he misses a dose     HPI: Joshua Davies is a 37 y.o. male here for refill of his Protonix.  The patient had some abdominal pain and was taking anti-inflammatory medication for a dog bite when he had seen me originally.  The patient was on Protonix and had a refill for 2 months recently called in but was told to follow-up with me to make sure nothing had changed prior to giving him any further medication refills. The patient denies any worry symptoms such as unexplained weight loss fevers chills nausea vomiting black stools or bloody stools.  He reports that whenever he stops the medication and he has his symptoms come back.  He also reports he had his symptoms since he was a young man.  Past Medical History:  Diagnosis Date   Eosinophilic esophagitis     Current Outpatient Medications  Medication Sig Dispense Refill   buPROPion (WELLBUTRIN XL) 150 MG 24 hr tablet Take 1 tablet (150 mg total) by mouth daily. 90 tablet 1   busPIRone (BUSPAR) 10 MG tablet Take 1 tablet (10 mg total) by mouth 3 (three) times daily. 270 tablet 1   fluticasone (FLONASE) 50 MCG/ACT nasal spray PLACE 1 SPRAY INTO BOTH NOSTRILS IN THE MORNING AND AT BEDTIME. 48 mL 0   hydrOXYzine (ATARAX) 10 MG tablet Take 1 tablet (10 mg total) by mouth 3 (three) times daily as needed. 270 tablet 1   montelukast (SINGULAIR) 10 MG tablet TAKE 1 TABLET BY MOUTH EVERYDAY AT BEDTIME 90 tablet 1   pantoprazole (PROTONIX) 40 MG tablet TAKE 1 TABLET (40 MG TOTAL) BY MOUTH DAILY BEFORE BREAKFAST. MUST SCHEDULE OFFICE VISIT 90 tablet 0   No current facility-administered medications for this visit.    Allergies as of 01/09/2023 - Review Complete 01/09/2023  Allergen Reaction Noted   Gluten  meal  05/23/2018   Wheat  05/23/2018    ROS:  General: Negative for anorexia, weight loss, fever, chills, fatigue, weakness. ENT: Negative for hoarseness, difficulty swallowing , nasal congestion. CV: Negative for chest pain, angina, palpitations, dyspnea on exertion, peripheral edema.  Respiratory: Negative for dyspnea at rest, dyspnea on exertion, cough, sputum, wheezing.  GI: See history of present illness. GU:  Negative for dysuria, hematuria, urinary incontinence, urinary frequency, nocturnal urination.  Endo: Negative for unusual weight change.    Physical Examination:   BP (!) 137/100 (BP Location: Left Arm, Patient Position: Sitting, Cuff Size: Normal)   Pulse 84   Temp 98.4 F (36.9 C) (Oral)   Wt 168 lb (76.2 kg)   BMI 22.41 kg/m   General: Well-nourished, well-developed in no acute distress.  Eyes: No icterus. Conjunctivae pink. Lungs: Clear to auscultation bilaterally. Non-labored. Heart: Regular rate and rhythm, no murmurs rubs or gallops.  Abdomen: Bowel sounds are normal, nontender, nondistended, no hepatosplenomegaly or masses, no abdominal bruits or hernia , no rebound or guarding.   Extremities: No lower extremity edema. No clubbing or deformities. Neuro: Alert and oriented x 3.  Grossly intact. Skin: Warm and dry, no jaundice.   Psych: Alert and cooperative, normal mood and affect.  Labs:    Imaging Studies: No results found.  Assessment and Plan:   Joshua Davies is  a 37 y.o. y/o male who comes in with a history of GERD and was on pantoprazole and responding well to it.  The patient denies any worry symptoms.  The patient will be given refills for his pantoprazole.  The patient has been told to contact me if his symptoms should change.  The patient has been explained the plan and agrees with it.     Midge Minium, MD. Clementeen Graham    Note: This dictation was prepared with Dragon dictation along with smaller phrase technology. Any transcriptional errors  that result from this process are unintentional.

## 2023-01-10 ENCOUNTER — Encounter: Payer: Self-pay | Admitting: Nurse Practitioner

## 2023-01-10 ENCOUNTER — Telehealth: Payer: Managed Care, Other (non HMO) | Admitting: Nurse Practitioner

## 2023-01-10 DIAGNOSIS — F419 Anxiety disorder, unspecified: Secondary | ICD-10-CM | POA: Diagnosis not present

## 2023-01-10 MED ORDER — CLONAZEPAM 0.5 MG PO TABS: 0.5000 mg | ORAL_TABLET | Freq: Two times a day (BID) | ORAL | 1 refills | Status: DC | PRN

## 2023-01-10 NOTE — Progress Notes (Signed)
There were no vitals taken for this visit.   Subjective:    Patient ID: Joshua Davies, male    DOB: June 30, 1986, 37 y.o.   MRN: [id number]  HPI: Joshua Davies is a 37 y.o. male  Chief Complaint  Patient presents with   Anxiety    Patient says he is having issues with his Anxiety, as he is currently going through his divorce being finalized.    ANXIETY Patient states he has finalized his divorce.  His blood pressure has been elevated due to being stressed.  He has tried taking the hydroxyzine and it is not helping calm him down.  He has tried taking benadryl without relief.  He is still taking the Buspar and Wellbutrin.  He has tried taking two Buspar and it hasn't helped him.  It feels like things are closing in on him.  Flowsheet Row Video Visit from 01/10/2023 in [facility]  PHQ-9 Total Score 18         01/10/2023    1:48 PM 05/27/2022    9:01 AM 02/24/2022    2:48 PM 08/13/2021    8:17 AM  GAD 7 : Generalized Anxiety Score  Nervous, Anxious, on Edge 3 1 2 2   Control/stop worrying 3 0 0 2  Worry too much - different things 3 0 0 1  Trouble relaxing 3 1 2 2   Restless 3 1 1 2   Easily annoyed or irritable 3 1 3 3   Afraid - awful might happen 1 0 0 3  Total GAD 7 Score 19 4 8 15   Anxiety Difficulty Very difficult Somewhat difficult Somewhat difficult Somewhat difficult       Relevant past medical, surgical, family and social history reviewed and updated as indicated. Interim medical history since our last visit reviewed. Allergies and medications reviewed and updated.  Review of Systems  Psychiatric/Behavioral:  Positive for dysphoric mood. Negative for suicidal ideas. The patient is nervous/anxious.     Per HPI unless specifically indicated above     Objective:    There were no vitals taken for this visit.  Wt Readings from Last 3 Encounters:  01/09/23 168 lb (76.2 kg)  05/27/22 163 lb 6.4 oz (74.1 kg)  02/24/22 165 lb 3.2 oz  (74.9 kg)    Physical Exam Vitals and nursing note reviewed.  Constitutional:      General: He is not in acute distress.    Appearance: He is not ill-appearing.  HENT:     Head: Normocephalic.     Right Ear: Hearing normal.     Left Ear: Hearing normal.     Nose: Nose normal.  Pulmonary:     Effort: Pulmonary effort is normal. No respiratory distress.  Neurological:     Mental Status: He is alert.  Psychiatric:        Mood and Affect: Mood normal.        Behavior: Behavior normal.        Thought Content: Thought content normal.        Judgment: Judgment normal.     Results for orders placed or performed in visit on 08/13/21  TSH  Result Value Ref Range   TSH 1.090 0.450 - 4.500 uIU/mL  Lipid panel  Result Value Ref Range   Cholesterol, Total 158 100 - 199 mg/dL   Triglycerides 409 0 - 149 mg/dL   HDL 43 >81 mg/dL   VLDL Cholesterol Cal 23 5 - 40 mg/dL  LDL Chol Calc (NIH) 92 0 - 99 mg/dL   Chol/HDL Ratio 3.7 0.0 - 5.0 ratio  CBC with Differential/Platelet  Result Value Ref Range   WBC 4.5 3.4 - 10.8 x10E3/uL   RBC 5.48 4.14 - 5.80 x10E6/uL   Hemoglobin 16.2 13.0 - 17.7 g/dL   Hematocrit 60.4 54.0 - 51.0 %   MCV 87 79 - 97 fL   MCH 29.6 26.6 - 33.0 pg   MCHC 33.9 31.5 - 35.7 g/dL   RDW 98.1 19.1 - 47.8 %   Platelets 307 150 - 450 x10E3/uL   Neutrophils 53 Not Estab. %   Lymphs 32 Not Estab. %   Monocytes 9 Not Estab. %   Eos 5 Not Estab. %   Basos 1 Not Estab. %   Neutrophils Absolute 2.4 1.4 - 7.0 x10E3/uL   Lymphocytes Absolute 1.4 0.7 - 3.1 x10E3/uL   Monocytes Absolute 0.4 0.1 - 0.9 x10E3/uL   EOS (ABSOLUTE) 0.2 0.0 - 0.4 x10E3/uL   Basophils Absolute 0.0 0.0 - 0.2 x10E3/uL   Immature Granulocytes 0 Not Estab. %   Immature Grans (Abs) 0.0 0.0 - 0.1 x10E3/uL  Comprehensive metabolic panel  Result Value Ref Range   Glucose 105 (H) 70 - 99 mg/dL   BUN 11 6 - 20 mg/dL   Creatinine, Ser 2.95 0.76 - 1.27 mg/dL   eGFR 93 >62 ZH/YQM/5.78   BUN/Creatinine  Ratio 10 9 - 20   Sodium 139 134 - 144 mmol/L   Potassium 4.1 3.5 - 5.2 mmol/L   Chloride 98 96 - 106 mmol/L   CO2 25 20 - 29 mmol/L   Calcium 9.8 8.7 - 10.2 mg/dL   Total Protein 7.2 6.0 - 8.5 g/dL   Albumin 5.0 4.0 - 5.0 g/dL   Globulin, Total 2.2 1.5 - 4.5 g/dL   Albumin/Globulin Ratio 2.3 (H) 1.2 - 2.2   Bilirubin Total 0.3 0.0 - 1.2 mg/dL   Alkaline Phosphatase 110 44 - 121 IU/L   AST 27 0 - 40 IU/L   ALT 45 (H) 0 - 44 IU/L  Hepatitis C Antibody  Result Value Ref Range   Hep C Virus Ab <0.1 0.0 - 0.9 s/co ratio  HIV Antibody (routine testing w rflx)  Result Value Ref Range   HIV Screen 4th Generation wRfx Non Reactive Non Reactive      Assessment & Plan:   Problem List Items Addressed This Visit       Other   Anxiety - Primary    Chronic.  Had been well controlled.  Exacerbated at this time due to family stressors.  Will start Klonazepam PRN for anxiety.  Discussed how to appropriately use medication.  Advised not to drink alcohol or use medication for sleep.  Continue with Wellbutrin and Buspar.  Follow up in 2 weeks.  Call sooner if concerns arise.       Relevant Orders   Ambulatory referral to Psychology     Follow up plan: Return in about 2 weeks (around 01/24/2023) for Depression/Anxiety FU (virtual).  This visit was completed via MyChart due to the restrictions of the COVID-19 pandemic. All issues as above were discussed and addressed. Physical exam was done as above through visual confirmation on MyChart. If it was felt that the patient should be evaluated in the office, they were directed there. The patient verbally consented to this visit. Location of the patient: Home Location of the provider: Office Those involved with this call:  Provider: Larae Grooms, NP  CMA: Malen Gauze, CMA Front Desk/Registration: Servando Snare This encounter was conducted via video.  I spent 30 dedicated to the care of this patient on the date of this encounter to include  previsit review of symptoms, plan of care and follow up, face to face time with the patient, and post visit ordering of testing.

## 2023-01-10 NOTE — Assessment & Plan Note (Signed)
Chronic.  Had been well controlled.  Exacerbated at this time due to family stressors.  Will start Klonazepam PRN for anxiety.  Discussed how to appropriately use medication.  Advised not to drink alcohol or use medication for sleep.  Continue with Wellbutrin and Buspar.  Follow up in 2 weeks.  Call sooner if concerns arise.

## 2023-01-10 NOTE — Telephone Encounter (Signed)
Appt has been made.

## 2023-01-11 NOTE — Progress Notes (Signed)
Call and left a detailed message for patient to call the office and schedule a follow up appt for 2 weeks with Clydie Braun.

## 2023-01-17 ENCOUNTER — Encounter: Payer: Self-pay | Admitting: Nurse Practitioner

## 2023-01-31 ENCOUNTER — Ambulatory Visit: Payer: Managed Care, Other (non HMO) | Admitting: Nurse Practitioner

## 2023-02-01 ENCOUNTER — Ambulatory Visit: Payer: Managed Care, Other (non HMO) | Admitting: Nurse Practitioner

## 2023-02-01 NOTE — Progress Notes (Deleted)
There were no vitals taken for this visit.   Subjective:    Patient ID: Joshua Davies, male    DOB: [birth year], 37 y.o.   MRN: [id number]  HPI: [name] is a 37 y.o. male  No chief complaint on file.  ANXIETY Patient states he has finalized his divorce.  His blood pressure has been elevated due to being stressed.  He has tried taking the hydroxyzine and it is not helping calm him down.  He has tried taking benadryl without relief.  He is still taking the Buspar and Wellbutrin.  He has tried taking two Buspar and it hasn't helped him.  It feels like things are closing in on him.  Flowsheet Row Video Visit from 01/10/2023 in [facility]  PHQ-9 Total Score 18         01/10/2023    1:48 PM 05/27/2022    9:01 AM 02/24/2022    2:48 PM 08/13/2021    8:17 AM  GAD 7 : Generalized Anxiety Score  Nervous, Anxious, on Edge 3 1 2 2   Control/stop worrying 3 0 0 2  Worry too much - different things 3 0 0 1  Trouble relaxing 3 1 2 2   Restless 3 1 1 2   Easily annoyed or irritable 3 1 3 3   Afraid - awful might happen 1 0 0 3  Total GAD 7 Score 19 4 8 15   Anxiety Difficulty Very difficult Somewhat difficult Somewhat difficult Somewhat difficult       Relevant past medical, surgical, family and social history reviewed and updated as indicated. Interim medical history since our last visit reviewed. Allergies and medications reviewed and updated.  Review of Systems  Psychiatric/Behavioral:  Positive for dysphoric mood. Negative for suicidal ideas. The patient is nervous/anxious.     Per HPI unless specifically indicated above     Objective:    There were no vitals taken for this visit.  Wt Readings from Last 3 Encounters:  01/09/23 168 lb (76.2 kg)  05/27/22 163 lb 6.4 oz (74.1 kg)  02/24/22 165 lb 3.2 oz (74.9 kg)    Physical Exam Vitals and nursing note reviewed.  Constitutional:      General: He is not in acute distress.    Appearance: He  is not ill-appearing.  HENT:     Head: Normocephalic.     Right Ear: Hearing normal.     Left Ear: Hearing normal.     Nose: Nose normal.  Pulmonary:     Effort: Pulmonary effort is normal. No respiratory distress.  Neurological:     Mental Status: He is alert.  Psychiatric:        Mood and Affect: Mood normal.        Behavior: Behavior normal.        Thought Content: Thought content normal.        Judgment: Judgment normal.    Results for orders placed or performed in visit on 08/13/21  TSH  Result Value Ref Range   TSH 1.090 0.450 - 4.500 uIU/mL  Lipid panel  Result Value Ref Range   Cholesterol, Total 158 100 - 199 mg/dL   Triglycerides 409 0 - 149 mg/dL   HDL 43 >81 mg/dL   VLDL Cholesterol Cal 23 5 - 40 mg/dL   LDL Chol Calc (NIH) 92 0 - 99 mg/dL   Chol/HDL Ratio 3.7 0.0 - 5.0 ratio  CBC with Differential/Platelet  Result Value Ref Range  WBC 4.5 3.4 - 10.8 x10E3/uL   RBC 5.48 4.14 - 5.80 x10E6/uL   Hemoglobin 16.2 13.0 - 17.7 g/dL   Hematocrit 57.8 46.9 - 51.0 %   MCV 87 79 - 97 fL   MCH 29.6 26.6 - 33.0 pg   MCHC 33.9 31.5 - 35.7 g/dL   RDW 62.9 52.8 - 41.3 %   Platelets 307 150 - 450 x10E3/uL   Neutrophils 53 Not Estab. %   Lymphs 32 Not Estab. %   Monocytes 9 Not Estab. %   Eos 5 Not Estab. %   Basos 1 Not Estab. %   Neutrophils Absolute 2.4 1.4 - 7.0 x10E3/uL   Lymphocytes Absolute 1.4 0.7 - 3.1 x10E3/uL   Monocytes Absolute 0.4 0.1 - 0.9 x10E3/uL   EOS (ABSOLUTE) 0.2 0.0 - 0.4 x10E3/uL   Basophils Absolute 0.0 0.0 - 0.2 x10E3/uL   Immature Granulocytes 0 Not Estab. %   Immature Grans (Abs) 0.0 0.0 - 0.1 x10E3/uL  Comprehensive metabolic panel  Result Value Ref Range   Glucose 105 (H) 70 - 99 mg/dL   BUN 11 6 - 20 mg/dL   Creatinine, Ser 2.44 0.76 - 1.27 mg/dL   eGFR 93 >01 UU/VOZ/3.66   BUN/Creatinine Ratio 10 9 - 20   Sodium 139 134 - 144 mmol/L   Potassium 4.1 3.5 - 5.2 mmol/L   Chloride 98 96 - 106 mmol/L   CO2 25 20 - 29 mmol/L   Calcium  9.8 8.7 - 10.2 mg/dL   Total Protein 7.2 6.0 - 8.5 g/dL   Albumin 5.0 4.0 - 5.0 g/dL   Globulin, Total 2.2 1.5 - 4.5 g/dL   Albumin/Globulin Ratio 2.3 (H) 1.2 - 2.2   Bilirubin Total 0.3 0.0 - 1.2 mg/dL   Alkaline Phosphatase 110 44 - 121 IU/L   AST 27 0 - 40 IU/L   ALT 45 (H) 0 - 44 IU/L  Hepatitis C Antibody  Result Value Ref Range   Hep C Virus Ab <0.1 0.0 - 0.9 s/co ratio  HIV Antibody (routine testing w rflx)  Result Value Ref Range   HIV Screen 4th Generation wRfx Non Reactive Non Reactive      Assessment & Plan:   Problem List Items Addressed This Visit   None    Follow up plan: No follow-ups on file.  This visit was completed via MyChart due to the restrictions of the COVID-19 pandemic. All issues as above were discussed and addressed. Physical exam was done as above through visual confirmation on MyChart. If it was felt that the patient should be evaluated in the office, they were directed there. The patient verbally consented to this visit. Location of the patient: Home Location of the provider: Office Those involved with this call:  Provider: Larae Grooms, NP CMA: Malen Gauze, CMA Front Desk/Registration: Servando Snare This encounter was conducted via video.  I spent 30 dedicated to the care of this patient on the date of this encounter to include previsit review of symptoms, plan of care and follow up, face to face time with the patient, and post visit ordering of testing.

## 2023-02-03 ENCOUNTER — Ambulatory Visit: Payer: Managed Care, Other (non HMO) | Admitting: Nurse Practitioner

## 2023-02-12 ENCOUNTER — Other Ambulatory Visit: Payer: Self-pay | Admitting: Nurse Practitioner

## 2023-02-12 DIAGNOSIS — F419 Anxiety disorder, unspecified: Secondary | ICD-10-CM

## 2023-02-13 NOTE — Telephone Encounter (Signed)
Requested Prescriptions  Pending Prescriptions Disp Refills   buPROPion (WELLBUTRIN XL) 150 MG 24 hr tablet [Pharmacy Med Name: BUPROPION HCL XL 150 MG TABLET] 90 tablet 0    Sig: TAKE 1 TABLET BY MOUTH EVERY DAY     Psychiatry: Antidepressants - bupropion Failed - 02/12/2023  8:43 AM      Failed - Cr in normal range and within 360 days    Creatinine, Ser  Date Value Ref Range Status  08/13/2021 1.08 0.76 - 1.27 mg/dL Final         Failed - AST in normal range and within 360 days    AST  Date Value Ref Range Status  08/13/2021 27 0 - 40 IU/L Final         Failed - ALT in normal range and within 360 days    ALT  Date Value Ref Range Status  08/13/2021 45 (H) 0 - 44 IU/L Final         Failed - Last BP in normal range    BP Readings from Last 1 Encounters:  01/09/23 (!) 132/96         Passed - Valid encounter within last 6 months    Recent Outpatient Visits           1 month ago Anxiety   [place] [facility] Larae Grooms, NP   8 months ago Anxiety   [place] [facility] [facility] Larae Grooms, NP   11 months ago Anxiety   [place] [facility] Larae Grooms, NP   1 year ago Annual physical exam   [place] [facility] Larae Grooms, NP   2 years ago Anxiety   [place] [facility] Larae Grooms, NP               busPIRone (BUSPAR) 10 MG tablet [Pharmacy Med Name: BUSPIRONE HCL 10 MG TABLET] 270 tablet 0    Sig: TAKE 1 TABLET BY MOUTH THREE TIMES A DAY     Psychiatry: Anxiolytics/Hypnotics - Non-controlled Passed - 02/12/2023  8:43 AM      Passed - Valid encounter within last 12 months    Recent Outpatient Visits           1 month ago Anxiety   [place] [facility] Larae Grooms, NP   8 months ago Anxiety   [place] [facility] Larae Grooms, NP   11 months ago Anxiety   [place] [facility] Larae Grooms, NP    1 year ago Annual physical exam   [place] [facility] Larae Grooms, NP   2 years ago Anxiety   [facility] Larae Grooms, NP

## 2023-05-17 ENCOUNTER — Other Ambulatory Visit: Payer: Self-pay | Admitting: Nurse Practitioner

## 2023-05-17 DIAGNOSIS — F419 Anxiety disorder, unspecified: Secondary | ICD-10-CM

## 2023-05-18 NOTE — Telephone Encounter (Signed)
Requested Prescriptions  Pending Prescriptions Disp Refills   busPIRone (BUSPAR) 10 MG tablet [Pharmacy Med Name: BUSPIRONE HCL 10 MG TABLET] 270 tablet 0    Sig: TAKE 1 TABLET BY MOUTH THREE TIMES A DAY     Psychiatry: Anxiolytics/Hypnotics - Non-controlled Passed - 05/17/2023  1:27 AM      Passed - Valid encounter within last 12 months    Recent Outpatient Visits           4 months ago Anxiety   [place] [facility] Larae Grooms, NP   11 months ago Anxiety   [place] [facility] Larae Grooms, NP   1 year ago Anxiety   [place] [facility] Larae Grooms, NP   1 year ago Annual physical exam   [place] [facility] Larae Grooms, NP   2 years ago Anxiety   [place] [facility] [place], Clydie Braun, NP               buPROPion (WELLBUTRIN XL) 150 MG 24 hr tablet [Pharmacy Med Name: BUPROPION HCL XL 150 MG TABLET] 90 tablet 0    Sig: TAKE 1 TABLET BY MOUTH EVERY DAY     Psychiatry: Antidepressants - bupropion Failed - 05/17/2023  1:27 AM      Failed - Cr in normal range and within 360 days    Creatinine, Ser  Date Value Ref Range Status  08/13/2021 1.08 0.76 - 1.27 mg/dL Final         Failed - AST in normal range and within 360 days    AST  Date Value Ref Range Status  08/13/2021 27 0 - 40 IU/L Final         Failed - ALT in normal range and within 360 days    ALT  Date Value Ref Range Status  08/13/2021 45 (H) 0 - 44 IU/L Final         Failed - Last BP in normal range    BP Readings from Last 1 Encounters:  01/09/23 (!) 132/96         Passed - Valid encounter within last 6 months    Recent Outpatient Visits           4 months ago Anxiety   [place] [facility] Larae Grooms, NP   11 months ago Anxiety   [place] [facility] Larae Grooms, NP   1 year ago Anxiety   [place] [facility] Larae Grooms, NP    1 year ago Annual physical exam   [place] [facility] Larae Grooms, NP   2 years ago Anxiety   [place] [facility] [place], Clydie Braun, NP               hydrOXYzine (ATARAX) 10 MG tablet [Pharmacy Med Name: HYDROXYZINE HCL 10 MG TABLET] 270 tablet 1    Sig: TAKE 1 TABLET BY MOUTH THREE TIMES A DAY AS NEEDED     Ear, Nose, and Throat:  Antihistamines 2 Failed - 05/17/2023  1:27 AM      Failed - Cr in normal range and within 360 days    Creatinine, Ser  Date Value Ref Range Status  08/13/2021 1.08 0.76 - 1.27 mg/dL Final         Passed - Valid encounter within last 12 months    Recent Outpatient Visits           4 months ago Anxiety  [place] [facility] Larae Grooms, NP   11 months ago Anxiety   [place] [facility] Larae Grooms, NP   1 year ago Anxiety   [place] [facility] Larae Grooms, NP   1 year ago Annual physical exam   [place] [facility] Larae Grooms, NP   2 years ago Anxiety   [place] [facility] Larae Grooms, NP

## 2023-08-12 ENCOUNTER — Other Ambulatory Visit: Payer: Self-pay | Admitting: Nurse Practitioner

## 2023-08-12 DIAGNOSIS — F419 Anxiety disorder, unspecified: Secondary | ICD-10-CM

## 2023-08-13 ENCOUNTER — Other Ambulatory Visit: Payer: Self-pay | Admitting: Nurse Practitioner

## 2023-08-13 DIAGNOSIS — F419 Anxiety disorder, unspecified: Secondary | ICD-10-CM

## 2023-08-14 NOTE — Telephone Encounter (Signed)
Requested medication (s) are due for refill today: yes  Requested medication (s) are on the active medication list: yes  Last refill:  05/18/23 #90/0  Future visit scheduled: no  Notes to clinic:  Unable to refill per protocol due to failed labs, no updated results.      Requested Prescriptions  Pending Prescriptions Disp Refills   buPROPion (WELLBUTRIN XL) 150 MG 24 hr tablet [Pharmacy Med Name: BUPROPION HCL XL 150 MG TABLET] 90 tablet 0    Sig: TAKE 1 TABLET BY MOUTH EVERY DAY     Psychiatry: Antidepressants - bupropion Failed - 08/14/2023 11:47 AM      Failed - Cr in normal range and within 360 days    Creatinine, Ser  Date Value Ref Range Status  08/13/2021 1.08 0.76 - 1.27 mg/dL Final         Failed - AST in normal range and within 360 days    AST  Date Value Ref Range Status  08/13/2021 27 0 - 40 IU/L Final         Failed - ALT in normal range and within 360 days    ALT  Date Value Ref Range Status  08/13/2021 45 (H) 0 - 44 IU/L Final         Failed - Last BP in normal range    BP Readings from Last 1 Encounters:  01/09/23 (!) 132/96         Failed - Valid encounter within last 6 months    Recent Outpatient Visits           7 months ago Anxiety   [place] [facility] Larae Grooms, NP   1 year ago Anxiety   [place] [facility] Larae Grooms, NP   1 year ago Anxiety   [place] [facility] Larae Grooms, NP   2 years ago Annual physical exam   [place] Larae Grooms, NP   2 years ago Anxiety   [place] [facility] Larae Grooms, NP

## 2023-08-14 NOTE — Telephone Encounter (Signed)
Requested Prescriptions  Pending Prescriptions Disp Refills   busPIRone (BUSPAR) 10 MG tablet [Pharmacy Med Name: BUSPIRONE HCL 10 MG TABLET] 270 tablet 1    Sig: TAKE 1 TABLET BY MOUTH THREE TIMES A DAY     Psychiatry: Anxiolytics/Hypnotics - Non-controlled Passed - 08/14/2023  4:18 PM      Passed - Valid encounter within last 12 months    Recent Outpatient Visits           7 months ago Anxiety   [place] [facility] Larae Grooms, NP   1 year ago Anxiety   [place] [facility] Larae Grooms, NP   1 year ago Anxiety   [place] [facility] Larae Grooms, NP   2 years ago Annual physical exam   [place] [facility] Larae Grooms, NP   2 years ago Anxiety   [place] [facility] Larae Grooms, NP

## 2023-08-21 ENCOUNTER — Other Ambulatory Visit: Payer: Self-pay | Admitting: Nurse Practitioner

## 2023-08-21 DIAGNOSIS — F419 Anxiety disorder, unspecified: Secondary | ICD-10-CM

## 2023-08-21 MED ORDER — BUPROPION HCL ER (XL) 150 MG PO TB24
150.0000 mg | ORAL_TABLET | Freq: Every day | ORAL | 0 refills | Status: DC
Start: 2023-08-21 — End: 2023-09-13

## 2023-09-13 ENCOUNTER — Other Ambulatory Visit: Payer: Self-pay | Admitting: Nurse Practitioner

## 2023-09-13 NOTE — Telephone Encounter (Signed)
 Requested Prescriptions  Pending Prescriptions Disp Refills   buPROPion  (WELLBUTRIN  XL) 150 MG 24 hr tablet [Pharmacy Med Name: BUPROPION  HCL XL 150 MG TABLET] 90 tablet 0    Sig: TAKE 1 TABLET BY MOUTH EVERY DAY     Psychiatry: Antidepressants - bupropion  Failed - 09/13/2023  1:57 PM      Failed - Cr in normal range and within 360 days    Creatinine, Ser  Date Value Ref Range Status  08/13/2021 1.08 0.76 - 1.27 mg/dL Final         Failed - AST in normal range and within 360 days    AST  Date Value Ref Range Status  08/13/2021 27 0 - 40 IU/L Final         Failed - ALT in normal range and within 360 days    ALT  Date Value Ref Range Status  08/13/2021 45 (H) 0 - 44 IU/L Final         Failed - Last BP in normal range    BP Readings from Last 1 Encounters:  01/09/23 (!) 132/96         Failed - Valid encounter within last 6 months    Recent Outpatient Visits           8 months ago Anxiety   [place] [facility] Aileen Alexanders, NP   1 year ago Anxiety   [place] [facility] Aileen Alexanders, NP   1 year ago Anxiety   [place] [facility] Aileen Alexanders, NP   2 years ago Annual physical exam   [facility] [place] [facility] Aileen Alexanders, NP   2 years ago Anxiety   [place] [facility] Aileen Alexanders, NP       Future Appointments             In 1 week Aileen Alexanders, NP [facility] [facility]

## 2023-09-21 ENCOUNTER — Telehealth: Payer: Managed Care, Other (non HMO) | Admitting: Nurse Practitioner

## 2023-09-21 ENCOUNTER — Encounter: Payer: Self-pay | Admitting: Nurse Practitioner

## 2023-09-21 VITALS — Ht 72.5 in | Wt 170.0 lb

## 2023-09-21 DIAGNOSIS — J0110 Acute frontal sinusitis, unspecified: Secondary | ICD-10-CM | POA: Diagnosis not present

## 2023-09-21 DIAGNOSIS — F419 Anxiety disorder, unspecified: Secondary | ICD-10-CM

## 2023-09-21 MED ORDER — AMOXICILLIN 500 MG PO CAPS: 500.0000 mg | ORAL_CAPSULE | Freq: Two times a day (BID) | ORAL | 0 refills | Status: AC

## 2023-09-21 MED ORDER — HYDROXYZINE HCL 10 MG PO TABS: 10.0000 mg | ORAL_TABLET | Freq: Three times a day (TID) | ORAL | 1 refills | Status: DC | PRN

## 2023-09-21 MED ORDER — BUPROPION HCL ER (XL) 150 MG PO TB24: 150.0000 mg | ORAL_TABLET | Freq: Every day | ORAL | 1 refills | Status: DC

## 2023-09-21 MED ORDER — BUSPIRONE HCL 5 MG PO TABS
5.0000 mg | ORAL_TABLET | Freq: Three times a day (TID) | ORAL | 1 refills | Status: AC
Start: 2023-09-21 — End: ?

## 2023-09-21 NOTE — Assessment & Plan Note (Signed)
Chronic.  Controlled.  Continue with current medication regimen of Wellbutrin 150mg  daily, Hydroxyzine TID and Buspar 5mg  TID.  Return to clinic in 6 months for reevaluation.  Call sooner if concerns arise.

## 2023-09-21 NOTE — Progress Notes (Signed)
Ht 6' 0.5" (1.842 m)   Wt 170 lb (77.1 kg)   BMI 22.74 kg/m    Subjective:    Patient ID: Joshua Davies, male    DOB: 08-03-1986, 38 y.o.   MRN: [id number]  HPI: Joshua Davies is a 38 y.o. male  Chief Complaint  Patient presents with   Medication Management    Refill on wellbutrin    ANXIETY Patient states he has been doing okay.  Life has been difficult but nothing out of the ordinary.  He is still doing Wellbutrin 150mg  and taking Buspar 5mg  TID.   He does use the hydroxyzine TID.     Flowsheet Row Video Visit from 09/21/2023 in [facility]  PHQ-9 Total Score 6         09/21/2023    1:17 PM 01/10/2023    1:48 PM 05/27/2022    9:01 AM 02/24/2022    2:48 PM  GAD 7 : Generalized Anxiety Score  Nervous, Anxious, on Edge 2 3 1 2   Control/stop worrying 2 3 0 0  Worry too much - different things 1 3 0 0  Trouble relaxing 3 3 1 2   Restless 1 3 1 1   Easily annoyed or irritable 1 3 1 3   Afraid - awful might happen 1 1 0 0  Total GAD 7 Score 11 19 4 8   Anxiety Difficulty  Very difficult Somewhat difficult Somewhat difficult   UPPER RESPIRATORY TRACT INFECTION Worst symptom: symptoms started on Monday. Having green congestion and ear ache.   Fever: no Cough: no Shortness of breath: no Wheezing: no Chest pain: no Chest tightness: no Chest congestion: no Nasal congestion: yes Runny nose: yes Post nasal drip: yes Sneezing: no Sore throat: yes Swollen glands: yes Sinus pressure: yes Headache: yes Face pain: yes Toothache: no Ear pain: yes bilateral Ear pressure: yes bilateral Eyes red/itching:no Eye drainage/crusting: no  Vomiting: no Rash: no Fatigue: yes Sick contacts: no Strep contacts: no  Context: worse Recurrent sinusitis: no Relief with OTC cold/cough medications: no  Treatments attempted: none      Relevant past medical, surgical, family and social history reviewed and updated as indicated. Interim medical history  since our last visit reviewed. Allergies and medications reviewed and updated.  Review of Systems  Constitutional:  Negative for fatigue and fever.  HENT:  Positive for congestion, ear pain, postnasal drip, rhinorrhea, sinus pressure, sinus pain and sore throat. Negative for sneezing.   Respiratory:  Negative for cough, chest tightness, shortness of breath and wheezing.   Gastrointestinal:  Negative for vomiting.  Skin:  Negative for rash.  Neurological:  Positive for headaches.  Psychiatric/Behavioral:  Positive for dysphoric mood. Negative for suicidal ideas. The patient is nervous/anxious.     Per HPI unless specifically indicated above     Objective:    Ht 6' 0.5" (1.842 m)   Wt 170 lb (77.1 kg)   BMI 22.74 kg/m   Wt Readings from Last 3 Encounters:  09/21/23 170 lb (77.1 kg)  01/09/23 168 lb (76.2 kg)  05/27/22 163 lb 6.4 oz (74.1 kg)    Physical Exam Vitals and nursing note reviewed.  Constitutional:      General: He is not in acute distress.    Appearance: He is not ill-appearing.  HENT:     Head: Normocephalic.     Right Ear: Hearing normal.     Left Ear: Hearing normal.     Nose: Nose normal.  Pulmonary:  Effort: Pulmonary effort is normal. No respiratory distress.  Neurological:     Mental Status: He is alert.  Psychiatric:        Mood and Affect: Mood normal.        Behavior: Behavior normal.        Thought Content: Thought content normal.        Judgment: Judgment normal.     Results for orders placed or performed in visit on 08/13/21  TSH   Collection Time: 08/13/21  8:31 AM  Result Value Ref Range   TSH 1.090 0.450 - 4.500 uIU/mL  Lipid panel   Collection Time: 08/13/21  8:31 AM  Result Value Ref Range   Cholesterol, Total 158 100 - 199 mg/dL   Triglycerides 161 0 - 149 mg/dL   HDL 43 >09 mg/dL   VLDL Cholesterol Cal 23 5 - 40 mg/dL   LDL Chol Calc (NIH) 92 0 - 99 mg/dL   Chol/HDL Ratio 3.7 0.0 - 5.0 ratio  CBC with Differential/Platelet    Collection Time: 08/13/21  8:31 AM  Result Value Ref Range   WBC 4.5 3.4 - 10.8 x10E3/uL   RBC 5.48 4.14 - 5.80 x10E6/uL   Hemoglobin 16.2 13.0 - 17.7 g/dL   Hematocrit 60.4 54.0 - 51.0 %   MCV 87 79 - 97 fL   MCH 29.6 26.6 - 33.0 pg   MCHC 33.9 31.5 - 35.7 g/dL   RDW 98.1 19.1 - 47.8 %   Platelets 307 150 - 450 x10E3/uL   Neutrophils 53 Not Estab. %   Lymphs 32 Not Estab. %   Monocytes 9 Not Estab. %   Eos 5 Not Estab. %   Basos 1 Not Estab. %   Neutrophils Absolute 2.4 1.4 - 7.0 x10E3/uL   Lymphocytes Absolute 1.4 0.7 - 3.1 x10E3/uL   Monocytes Absolute 0.4 0.1 - 0.9 x10E3/uL   EOS (ABSOLUTE) 0.2 0.0 - 0.4 x10E3/uL   Basophils Absolute 0.0 0.0 - 0.2 x10E3/uL   Immature Granulocytes 0 Not Estab. %   Immature Grans (Abs) 0.0 0.0 - 0.1 x10E3/uL  Comprehensive metabolic panel   Collection Time: 08/13/21  8:31 AM  Result Value Ref Range   Glucose 105 (H) 70 - 99 mg/dL   BUN 11 6 - 20 mg/dL   Creatinine, Ser 2.95 0.76 - 1.27 mg/dL   eGFR 93 >62 ZH/YQM/5.78   BUN/Creatinine Ratio 10 9 - 20   Sodium 139 134 - 144 mmol/L   Potassium 4.1 3.5 - 5.2 mmol/L   Chloride 98 96 - 106 mmol/L   CO2 25 20 - 29 mmol/L   Calcium 9.8 8.7 - 10.2 mg/dL   Total Protein 7.2 6.0 - 8.5 g/dL   Albumin 5.0 4.0 - 5.0 g/dL   Globulin, Total 2.2 1.5 - 4.5 g/dL   Albumin/Globulin Ratio 2.3 (H) 1.2 - 2.2   Bilirubin Total 0.3 0.0 - 1.2 mg/dL   Alkaline Phosphatase 110 44 - 121 IU/L   AST 27 0 - 40 IU/L   ALT 45 (H) 0 - 44 IU/L  Hepatitis C Antibody   Collection Time: 08/13/21  8:31 AM  Result Value Ref Range   Hep C Virus Ab <0.1 0.0 - 0.9 s/co ratio  HIV Antibody (routine testing w rflx)   Collection Time: 08/13/21  8:31 AM  Result Value Ref Range   HIV Screen 4th Generation wRfx Non Reactive Non Reactive      Assessment & Plan:   Problem List Items Addressed  This Visit       Other   Anxiety - Primary   Chronic.  Controlled.  Continue with current medication regimen of Wellbutrin 150mg   daily, Hydroxyzine TID and Buspar 5mg  TID.  Return to clinic in 6 months for reevaluation.  Call sooner if concerns arise.        Relevant Medications   busPIRone (BUSPAR) 5 MG tablet   buPROPion (WELLBUTRIN XL) 150 MG 24 hr tablet   hydrOXYzine (ATARAX) 10 MG tablet   Other Visit Diagnoses       Acute non-recurrent frontal sinusitis       Will treat with amoxicllin.  Compelte course of antibiotics.  Follow up if not improved.   Relevant Medications   amoxicillin (AMOXIL) 500 MG capsule        Follow up plan: Return in about 6 months (around 03/20/2024) for Depression/Anxiety FU.  This visit was completed via MyChart due to the restrictions of the COVID-19 pandemic. All issues as above were discussed and addressed. Physical exam was done as above through visual confirmation on MyChart. If it was felt that the patient should be evaluated in the office, they were directed there. The patient verbally consented to this visit. Location of the patient: Home Location of the provider: Office Those involved with this call:  Provider: Larae Grooms, NP CMA: Oswaldo Conroy, CMA Front Desk/Registration: Servando Snare This encounter was conducted via video.  I spent 20 dedicated to the care of this patient on the date of this encounter to include previsit review of symptoms, plan of care and follow up, face to face time with the patient, and post visit ordering of testing.

## 2023-11-03 ENCOUNTER — Encounter

## 2023-11-03 ENCOUNTER — Other Ambulatory Visit: Payer: Self-pay | Admitting: Nurse Practitioner

## 2023-11-06 NOTE — Telephone Encounter (Signed)
 Requested medication (s) are due for refill today: routing for review  Requested medication (s) are on the active medication list: no  Last refill:  unknown  Future visit scheduled: no  Notes to clinic:  Unable to refill per protocol, cannot delegate. Possible new Rx, no last refill date.      Requested Prescriptions  Pending Prescriptions Disp Refills   LORazepam (ATIVAN) 0.5 MG tablet [Pharmacy Med Name: LORAZEPAM 0.5 MG TABLET] 10 tablet     Sig: TAKE 1 TABLET BY MOUTH EVERY DAY AS NEEDED FOR ANXIETY     Not Delegated - Psychiatry: Anxiolytics/Hypnotics 2 Failed - 11/06/2023  9:23 AM      Failed - This refill cannot be delegated      Failed - Urine Drug Screen completed in last 360 days      Passed - Patient is not pregnant      Passed - Valid encounter within last 6 months    Recent Outpatient Visits           1 month ago Anxiety   [place] [facility] Larae Grooms, NP   10 months ago Anxiety   [place] [facility] Larae Grooms, NP   1 year ago Anxiety   [place] [facility] Larae Grooms, NP   1 year ago Anxiety   [place] [facility] Larae Grooms, NP   2 years ago Annual physical exam   [place] [facility] Larae Grooms, NP

## 2024-02-16 ENCOUNTER — Other Ambulatory Visit: Payer: Self-pay | Admitting: Nurse Practitioner

## 2024-02-16 ENCOUNTER — Other Ambulatory Visit: Payer: Self-pay | Admitting: Gastroenterology

## 2024-02-16 DIAGNOSIS — F419 Anxiety disorder, unspecified: Secondary | ICD-10-CM

## 2024-02-19 NOTE — Telephone Encounter (Signed)
 Rx 09/21/23 #270 1RF- too soon and need appointment  Requested Prescriptions  Pending Prescriptions Disp Refills   busPIRone  (BUSPAR ) 10 MG tablet [Pharmacy Med Name: BUSPIRONE  HCL 10 MG TABLET] 270 tablet 1    Sig: TAKE 1 TABLET BY MOUTH THREE TIMES A DAY     Psychiatry: Anxiolytics/Hypnotics - Non-controlled Failed - 02/19/2024  3:29 PM      Failed - Valid encounter within last 12 months    Recent Outpatient Visits   None

## 2024-05-17 ENCOUNTER — Other Ambulatory Visit: Payer: Self-pay | Admitting: Nurse Practitioner

## 2024-05-17 ENCOUNTER — Other Ambulatory Visit: Payer: Self-pay | Admitting: Gastroenterology

## 2024-05-17 NOTE — Telephone Encounter (Signed)
 Requested Prescriptions  Pending Prescriptions Disp Refills   hydrOXYzine  (ATARAX ) 10 MG tablet [Pharmacy Med Name: HYDROXYZINE  HCL 10 MG TABLET] 270 tablet 1    Sig: TAKE 1 TABLET BY MOUTH THREE TIMES A DAY AS NEEDED     Ear, Nose, and Throat:  Antihistamines 2 Failed - 05/17/2024  2:19 PM      Failed - Cr in normal range and within 360 days    Creatinine, Ser  Date Value Ref Range Status  08/13/2021 1.08 0.76 - 1.27 mg/dL Final         Failed - Valid encounter within last 12 months    Recent Outpatient Visits   None

## 2024-06-17 ENCOUNTER — Other Ambulatory Visit: Payer: Self-pay | Admitting: Nurse Practitioner

## 2024-06-19 NOTE — Telephone Encounter (Signed)
 Courtesy refill given, appointment needed.   Requested Prescriptions  Pending Prescriptions Disp Refills   hydrOXYzine  (ATARAX ) 10 MG tablet [Pharmacy Med Name: HYDROXYZINE  HCL 10 MG TABLET] 270 tablet 1    Sig: TAKE 1 TABLET BY MOUTH THREE TIMES A DAY AS NEEDED     Ear, Nose, and Throat:  Antihistamines 2 Failed - 06/19/2024  9:59 AM      Failed - Cr in normal range and within 360 days    Creatinine, Ser  Date Value Ref Range Status  08/13/2021 1.08 0.76 - 1.27 mg/dL Final         Failed - Valid encounter within last 12 months    Recent Outpatient Visits   None

## 2024-06-21 ENCOUNTER — Other Ambulatory Visit: Payer: Self-pay | Admitting: Nurse Practitioner

## 2024-06-22 NOTE — Telephone Encounter (Signed)
 Duplicate request, OV needed.  Requested Prescriptions  Pending Prescriptions Disp Refills   hydrOXYzine  (ATARAX ) 10 MG tablet [Pharmacy Med Name: HYDROXYZINE  HCL 10 MG TABLET] 90 tablet 0    Sig: TAKE 1 TABLET BY MOUTH THREE TIMES A DAY AS NEEDED     Ear, Nose, and Throat:  Antihistamines 2 Failed - 06/22/2024  9:33 AM      Failed - Cr in normal range and within 360 days    Creatinine, Ser  Date Value Ref Range Status  08/13/2021 1.08 0.76 - 1.27 mg/dL Final         Failed - Valid encounter within last 12 months    Recent Outpatient Visits   None

## 2024-06-30 ENCOUNTER — Other Ambulatory Visit: Payer: Self-pay | Admitting: Nurse Practitioner

## 2024-07-02 NOTE — Telephone Encounter (Signed)
 appointment needed.   Requested Prescriptions  Pending Prescriptions Disp Refills   buPROPion  (WELLBUTRIN  XL) 150 MG 24 hr tablet [Pharmacy Med Name: BUPROPION  HCL XL 150 MG TABLET] 90 tablet 1    Sig: TAKE 1 TABLET BY MOUTH EVERY DAY     Psychiatry: Antidepressants - bupropion  Failed - 07/02/2024 12:01 PM      Failed - Cr in normal range and within 360 days    Creatinine, Ser  Date Value Ref Range Status  08/13/2021 1.08 0.76 - 1.27 mg/dL Final         Failed - AST in normal range and within 360 days    AST  Date Value Ref Range Status  08/13/2021 27 0 - 40 IU/L Final         Failed - ALT in normal range and within 360 days    ALT  Date Value Ref Range Status  08/13/2021 45 (H) 0 - 44 IU/L Final         Failed - Last BP in normal range    BP Readings from Last 1 Encounters:  01/09/23 (!) 132/96         Failed - Valid encounter within last 6 months    Recent Outpatient Visits   None

## 2024-07-08 ENCOUNTER — Other Ambulatory Visit: Payer: Self-pay | Admitting: Nurse Practitioner

## 2024-07-08 NOTE — Telephone Encounter (Unsigned)
 Copied from CRM [id number]. Topic: Clinical - Medication Refill >> Jul 08, 2024  2:17 PM Joshua Davies wrote: Medication: buPROPion  (WELLBUTRIN  XL) 150 MG 24 hr tablet [[id number]]  Has the patient contacted their pharmacy? Yes, they asked patient to call the office. (Agent: If no, request that the patient contact the pharmacy for the refill. If patient does not wish to contact the pharmacy document the reason why and proceed with request.) (Agent: If yes, when and what did the pharmacy advise?)  This is the patient's preferred pharmacy:  [id number] - [place], [place] - [address] [place] [address] Phone: [phone] Fax: [phone]  Is this the correct pharmacy for this prescription? Yes If no, delete pharmacy and type the correct one.   Has the prescription been filled recently? No  Is the patient out of the medication? Yes  Has the patient been seen for an appointment in the last year OR does the patient have an upcoming appointment? Yes, he has an appointment on 07/17/2024 and just needs a refill to hold him over until them.   Can we respond through MyChart? Yes  Agent: Please be advised that Rx refills may take up to 3 business days. We ask that you follow-up with your pharmacy.

## 2024-07-10 MED ORDER — BUPROPION HCL ER (XL) 150 MG PO TB24: 150.0000 mg | ORAL_TABLET | Freq: Every day | ORAL | 0 refills | Status: DC

## 2024-07-10 NOTE — Telephone Encounter (Signed)
 Patient needs refill to get to next appointment.

## 2024-07-10 NOTE — Telephone Encounter (Signed)
 Requested medication (s) are due for refill today: yes  Requested medication (s) are on the active medication list: yes  Last refill:  09/23/23  Future visit scheduled: yes  Notes to clinic:  Unable to refill per protocol, courtesy refill already given, routing for provider approval.      Requested Prescriptions  Pending Prescriptions Disp Refills   buPROPion  (WELLBUTRIN  XL) 150 MG 24 hr tablet 90 tablet 1    Sig: Take 1 tablet (150 mg total) by mouth daily.     Psychiatry: Antidepressants - bupropion  Failed - 07/10/2024  2:37 PM      Failed - Cr in normal range and within 360 days    Creatinine, Ser  Date Value Ref Range Status  08/13/2021 1.08 0.76 - 1.27 mg/dL Final         Failed - AST in normal range and within 360 days    AST  Date Value Ref Range Status  08/13/2021 27 0 - 40 IU/L Final         Failed - ALT in normal range and within 360 days    ALT  Date Value Ref Range Status  08/13/2021 45 (H) 0 - 44 IU/L Final         Failed - Last BP in normal range    BP Readings from Last 1 Encounters:  01/09/23 (!) 132/96         Failed - Valid encounter within last 6 months    Recent Outpatient Visits   None

## 2024-07-17 ENCOUNTER — Ambulatory Visit: Admitting: Nurse Practitioner

## 2024-07-18 ENCOUNTER — Ambulatory Visit: Admitting: Nurse Practitioner

## 2024-07-18 ENCOUNTER — Encounter: Payer: Self-pay | Admitting: Nurse Practitioner

## 2024-07-18 VITALS — BP 120/89 | HR 77 | Temp 97.3°F | Ht 72.52 in

## 2024-07-18 DIAGNOSIS — F419 Anxiety disorder, unspecified: Secondary | ICD-10-CM | POA: Diagnosis not present

## 2024-07-18 NOTE — Progress Notes (Signed)
 BP 120/89 (BP Location: Left Arm, Patient Position: Sitting, Cuff Size: Normal)   Pulse 77   Temp (!) 97.3 F (36.3 C) (Oral)   Ht 6' 0.52 (1.842 m)   SpO2 98%   BMI 22.73 kg/m    Subjective:    Patient ID: Joshua Davies, male    DOB: [birth year], 38 y.o.   MRN: [id number]  HPI: Joshua Davies is a 38 y.o. male  Chief Complaint  Patient presents with   Medication Refill    Possibly adjusting meds for the patient. Patient hasn't been taking Buspar  or Wellbutrin  in 10 days due to running out.    ANXIETY Patient states he has been doing okay.  He has been out of the Wellbutrin  for about 3 weeks and feels like he is doing pretty well.  When he is feeling anxious he takes Hydroxyzine  which has been helping.  He has not been taking the Buspar  either- wasn't sure if he was able to due to not taking the Wellbutrin .  Life has been difficult but nothing out of the ordinary.     Flowsheet Row Office Visit from 07/18/2024 in [facility]  PHQ-9 Total Score 7      07/18/2024    3:45 PM 09/21/2023    1:17 PM 01/10/2023    1:48 PM 05/27/2022    9:01 AM  GAD 7 : Generalized Anxiety Score  Nervous, Anxious, on Edge 1 2 3 1   Control/stop worrying 1 2 3  0  Worry too much - different things 1 1 3  0  Trouble relaxing 2 3 3 1   Restless 2 1 3 1   Easily annoyed or irritable 2 1 3 1   Afraid - awful might happen 0 1 1 0  Total GAD 7 Score 9 11 19 4   Anxiety Difficulty Somewhat difficult  Very difficult Somewhat difficult      Relevant past medical, surgical, family and social history reviewed and updated as indicated. Interim medical history since our last visit reviewed. Allergies and medications reviewed and updated.  Review of Systems  Psychiatric/Behavioral:  Positive for dysphoric mood. Negative for suicidal ideas. The patient is nervous/anxious.     Per HPI unless specifically indicated above     Objective:    BP 120/89 (BP Location: Left Arm,  Patient Position: Sitting, Cuff Size: Normal)   Pulse 77   Temp (!) 97.3 F (36.3 C) (Oral)   Ht 6' 0.52 (1.842 m)   SpO2 98%   BMI 22.73 kg/m   Wt Readings from Last 3 Encounters:  09/21/23 170 lb (77.1 kg)  01/09/23 168 lb (76.2 kg)  05/27/22 163 lb 6.4 oz (74.1 kg)    Physical Exam Vitals and nursing note reviewed.  Constitutional:      General: He is not in acute distress.    Appearance: Normal appearance. He is not ill-appearing, toxic-appearing or diaphoretic.  HENT:     Head: Normocephalic.     Right Ear: External ear normal.     Left Ear: External ear normal.     Nose: Nose normal. No congestion or rhinorrhea.     Mouth/Throat:     Mouth: Mucous membranes are moist.  Eyes:     General:        Right eye: No discharge.        Left eye: No discharge.     Extraocular Movements: Extraocular movements intact.     Conjunctiva/sclera: Conjunctivae normal.     Pupils: Pupils  are equal, round, and reactive to light.  Cardiovascular:     Rate and Rhythm: Normal rate and regular rhythm.     Heart sounds: No murmur heard. Pulmonary:     Effort: Pulmonary effort is normal. No respiratory distress.     Breath sounds: Normal breath sounds. No wheezing, rhonchi or rales.  Abdominal:     General: Abdomen is flat. Bowel sounds are normal.  Musculoskeletal:     Cervical back: Normal range of motion and neck supple.  Skin:    General: Skin is warm and dry.     Capillary Refill: Capillary refill takes less than 2 seconds.  Neurological:     General: No focal deficit present.     Mental Status: He is alert and oriented to person, place, and time.  Psychiatric:        Mood and Affect: Mood normal.        Behavior: Behavior normal.        Thought Content: Thought content normal.        Judgment: Judgment normal.     Results for orders placed or performed in visit on 08/13/21  TSH   Collection Time: 08/13/21  8:31 AM  Result Value Ref Range   TSH 1.090 0.450 - 4.500 uIU/mL   Lipid panel   Collection Time: 08/13/21  8:31 AM  Result Value Ref Range   Cholesterol, Total 158 100 - 199 mg/dL   Triglycerides 873 0 - 149 mg/dL   HDL 43 >60 mg/dL   VLDL Cholesterol Cal 23 5 - 40 mg/dL   LDL Chol Calc (NIH) 92 0 - 99 mg/dL   Chol/HDL Ratio 3.7 0.0 - 5.0 ratio  CBC with Differential/Platelet   Collection Time: 08/13/21  8:31 AM  Result Value Ref Range   WBC 4.5 3.4 - 10.8 x10E3/uL   RBC 5.48 4.14 - 5.80 x10E6/uL   Hemoglobin 16.2 13.0 - 17.7 g/dL   Hematocrit 52.1 62.4 - 51.0 %   MCV 87 79 - 97 fL   MCH 29.6 26.6 - 33.0 pg   MCHC 33.9 31.5 - 35.7 g/dL   RDW 87.3 88.3 - 84.5 %   Platelets 307 150 - 450 x10E3/uL   Neutrophils 53 Not Estab. %   Lymphs 32 Not Estab. %   Monocytes 9 Not Estab. %   Eos 5 Not Estab. %   Basos 1 Not Estab. %   Neutrophils Absolute 2.4 1.4 - 7.0 x10E3/uL   Lymphocytes Absolute 1.4 0.7 - 3.1 x10E3/uL   Monocytes Absolute 0.4 0.1 - 0.9 x10E3/uL   EOS (ABSOLUTE) 0.2 0.0 - 0.4 x10E3/uL   Basophils Absolute 0.0 0.0 - 0.2 x10E3/uL   Immature Granulocytes 0 Not Estab. %   Immature Grans (Abs) 0.0 0.0 - 0.1 x10E3/uL  Comprehensive metabolic panel   Collection Time: 08/13/21  8:31 AM  Result Value Ref Range   Glucose 105 (H) 70 - 99 mg/dL   BUN 11 6 - 20 mg/dL   Creatinine, Ser 8.91 0.76 - 1.27 mg/dL   eGFR 93 >40 fO/fpw/8.26   BUN/Creatinine Ratio 10 9 - 20   Sodium 139 134 - 144 mmol/L   Potassium 4.1 3.5 - 5.2 mmol/L   Chloride 98 96 - 106 mmol/L   CO2 25 20 - 29 mmol/L   Calcium 9.8 8.7 - 10.2 mg/dL   Total Protein 7.2 6.0 - 8.5 g/dL   Albumin 5.0 4.0 - 5.0 g/dL   Globulin, Total 2.2 1.5 - 4.5 g/dL  Albumin/Globulin Ratio 2.3 (H) 1.2 - 2.2   Bilirubin Total 0.3 0.0 - 1.2 mg/dL   Alkaline Phosphatase 110 44 - 121 IU/L   AST 27 0 - 40 IU/L   ALT 45 (H) 0 - 44 IU/L  Hepatitis C Antibody   Collection Time: 08/13/21  8:31 AM  Result Value Ref Range   Hep C Virus Ab <0.1 0.0 - 0.9 s/co ratio  HIV Antibody (routine testing  w rflx)   Collection Time: 08/13/21  8:31 AM  Result Value Ref Range   HIV Screen 4th Generation wRfx Non Reactive Non Reactive      Assessment & Plan:   Problem List Items Addressed This Visit       Other   Anxiety - Primary   Chronic.  Controlled with PRN Hydroxyzine .  He is open to restarting Wellbutrin  if needed.  Discussed using Buspar  PRN for anxiety.  Return to clinic in 3 months for reevaluation.  Call sooner if concerns arise.          Follow up plan: Return in about 3 months (around 10/18/2024) for Depression/Anxiety FU.  This visit was completed via MyChart due to the restrictions of the COVID-19 pandemic. All issues as above were discussed and addressed. Physical exam was done as above through visual confirmation on MyChart. If it was felt that the patient should be evaluated in the office, they were directed there. The patient verbally consented to this visit. Location of the patient: Home Location of the provider: Office Those involved with this call:  Provider: Darice Petty, NP CMA: Rolande Edison, CMA Front Desk/Registration: Claretta Maiden This encounter was conducted via video.  I spent 20 dedicated to the care of this patient on the date of this encounter to include previsit review of symptoms, plan of care and follow up, face to face time with the patient, and post visit ordering of testing.

## 2024-07-19 NOTE — Assessment & Plan Note (Signed)
 Chronic.  Controlled with PRN Hydroxyzine .  He is open to restarting Wellbutrin  if needed.  Discussed using Buspar  PRN for anxiety.  Return to clinic in 3 months for reevaluation.  Call sooner if concerns arise.

## 2024-08-02 ENCOUNTER — Emergency Department
Admission: EM | Admit: 2024-08-02 | Discharge: 2024-08-02 | Disposition: A | Discharge status: Home / Self Care | Payer: Worker's Compensation | Attending: Emergency Medicine | Admitting: Emergency Medicine

## 2024-08-02 ENCOUNTER — Emergency Department: Payer: Worker's Compensation

## 2024-08-02 ENCOUNTER — Encounter: Payer: Self-pay | Admitting: Emergency Medicine

## 2024-08-02 ENCOUNTER — Other Ambulatory Visit: Payer: Self-pay

## 2024-08-02 DIAGNOSIS — Z23 Encounter for immunization: Secondary | ICD-10-CM | POA: Insufficient documentation

## 2024-08-02 DIAGNOSIS — S81831A Puncture wound without foreign body, right lower leg, initial encounter: Secondary | ICD-10-CM | POA: Insufficient documentation

## 2024-08-02 DIAGNOSIS — W540XXA Bitten by dog, initial encounter: Secondary | ICD-10-CM | POA: Insufficient documentation

## 2024-08-02 DIAGNOSIS — Y990 Civilian activity done for income or pay: Secondary | ICD-10-CM | POA: Insufficient documentation

## 2024-08-02 MED ORDER — AMOXICILLIN-POT CLAVULANATE 875-125 MG PO TABS: 1.0000 | ORAL_TABLET | Freq: Two times a day (BID) | ORAL | 0 refills | Status: AC

## 2024-08-02 MED ORDER — TETANUS-DIPHTH-ACELL PERTUSSIS 5-2-15.5 LF-MCG/0.5 IM SUSP
0.5000 mL | Freq: Once | INTRAMUSCULAR | Status: AC
  Administered 2024-08-02: 0.5 mL via INTRAMUSCULAR
  Filled 2024-08-02: qty 0.5

## 2024-08-02 MED ORDER — AMOXICILLIN-POT CLAVULANATE 875-125 MG PO TABS
1.0000 | ORAL_TABLET | Freq: Once | ORAL | Status: AC
  Administered 2024-08-02: 1 via ORAL
  Filled 2024-08-02: qty 1

## 2024-08-02 MED ORDER — LIDOCAINE-EPINEPHRINE-TETRACAINE (LET) TOPICAL GEL
3.0000 mL | Freq: Once | TOPICAL | Status: AC
  Administered 2024-08-02: 3 mL via TOPICAL
  Filled 2024-08-02: qty 3

## 2024-08-02 NOTE — ED Triage Notes (Signed)
 Pt arrives ambulatory to triage, gait steady, no acute distress noted reporting dog bite to right lower leg. 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 Pt denies rabies vaccination status for animal.

## 2024-08-02 NOTE — ED Provider Notes (Signed)
 [place] EMERGENCY DEPARTMENT AT [facility] Provider Note   CSN: [id number] Arrival date & time: 08/02/24  2001     Patient presents with: Animal Bite   Joshua Davies is a 38 y.o. male presents to the emergency department for work Injury to the right lower extremity.  Patient was bitten by dog at work.  He is [facility].  Dog bit him through his pants and along his right lower leg.  No pain or discomfort.  Patient states he would not of came in unless it was continuing to bleed.  Denies any numbness or tingling.  No other injury to his body.   HPI     Prior to Admission medications   Medication Sig Start Date End Date Taking? Authorizing Provider  amoxicillin -clavulanate (AUGMENTIN ) 875-125 MG tablet Take 1 tablet by mouth 2 (two) times daily for 7 days. 08/02/24 08/09/24 Yes Charlene Debby BROCKS, PA-C  busPIRone  (BUSPAR ) 5 MG tablet Take 1 tablet (5 mg total) by mouth 3 (three) times daily. 09/21/23   Melvin Pao, NP  fluticasone  (FLONASE ) 50 MCG/ACT nasal spray PLACE 1 SPRAY INTO BOTH NOSTRILS IN THE MORNING AND AT BEDTIME. 09/12/21   Melvin Pao, NP  hydrOXYzine  (ATARAX ) 10 MG tablet TAKE 1 TABLET BY MOUTH THREE TIMES A DAY AS NEEDED 05/17/24   Melvin Pao, NP  montelukast  (SINGULAIR ) 10 MG tablet TAKE 1 TABLET BY MOUTH EVERYDAY AT BEDTIME 05/27/22   Melvin Pao, NP  pantoprazole  (PROTONIX ) 40 MG tablet TAKE 1 TABLET BY MOUTH EVERY DAY BEFORE BREAKFAST 05/20/24   Jinny Carmine, MD  zolpidem  (AMBIEN ) 5 MG tablet Take 1 tablet (5 mg total) by mouth at bedtime as needed for sleep. 08/27/18 08/16/19  Stuart Vernell Norris, PA-C    Allergies: Gluten meal and Wheat    Review of Systems  Updated Vital Signs BP (!) 140/101 (BP Location: Left Arm)   Pulse 87   Temp 98.3 F (36.8 C) (Oral)   Resp 18   SpO2 100%   Physical Exam Constitutional:      Appearance: He is well-developed.  HENT:     Head: Normocephalic and atraumatic.  Eyes:      Conjunctiva/sclera: Conjunctivae normal.  Cardiovascular:     Rate and Rhythm: Normal rate.  Pulmonary:     Effort: Pulmonary effort is normal. No respiratory distress.  Musculoskeletal:        General: Normal range of motion.     Cervical back: Normal range of motion.     Comments: Right lower extremity with puncture wound to the right anterior middle shin.  Puncture wound is 0.5 x 0.5 cm, there is mild active drainage, a slow bleed is present.  No visible or palpable foreign body.  Wound was thoroughly irrigated and cleansed with Betadine and saline and then topical let was applied.  Silver nitrate stick was used to cauterize slow bleed in the soft tissue which worked well and then no bleeding was present.  A Surgicel patch was placed over the wound along with a compressive dressing.  Patient is neurovascular intact.  Compartments are soft.  Skin:    General: Skin is warm.     Findings: No rash.  Neurological:     Mental Status: He is alert and oriented to person, place, and time.  Psychiatric:        Behavior: Behavior normal.        Thought Content: Thought content normal.     (all labs ordered are listed, but  only abnormal results are displayed) Labs Reviewed - No data to display  EKG: None  Radiology: DG Tibia/Fibula Right Result Date: 08/02/2024 CLINICAL DATA:  Dog bite to the right shin. EXAM: RIGHT TIBIA AND FIBULA - 2 VIEW COMPARISON:  None Available. FINDINGS: There is no evidence of fracture or other focal bone lesions. The cortical margins are intact. No knee or ankle dislocation. Soft tissue edema anteriorly. No radiopaque foreign body. No soft tissue gas. IMPRESSION: Soft tissue edema. No fracture or radiopaque foreign body. Electronically Signed   By: Andrea Gasman M.D.   On: 08/02/2024 20:55     Procedures   Medications Ordered in the ED  lidocaine -EPINEPHrine -tetracaine  (LET) topical gel (has no administration in time range)  Tdap (ADACEL ) injection 0.5 mL  (0.5 mLs Intramuscular Given 08/02/24 2109)  amoxicillin -clavulanate (AUGMENTIN ) 875-125 MG per tablet 1 tablet (1 tablet Oral Given 08/02/24 2055)  lidocaine -EPINEPHrine -tetracaine  (LET) topical gel (3 mLs Topical Given 08/02/24 2055)                                    Medical Decision Making Amount and/or Complexity of Data Reviewed Radiology: ordered.  Risk Prescription drug management.   38 year old male with work comp injury to the right lower extremity.  He was bit by dog along the right shin with a single puncture wound.  Tetanus updated in the ED.  Tib-fib x-ray showed no evidence of acute bony abnormality or foreign body.  He is placed on Augmentin  for prophylaxis.  Wound was thoroughly irrigated and cleansed and he was anesthetized with topical lidocaine  epi and tetracaine  and a silver nitrate stick was used to cauterize a small superficial bleeding vessel.  Surgicel was applied along with a compressive dressing.  Patient remain out of work for 24 hours and will keep compressive dressing on for 24 hours.  He is given strict return precautions and signs symptoms return to the ER for.  He will follow-up with occupational health in 7 to 10 days for recheck. Final diagnoses:  Dog bite, initial encounter  Puncture wound of right lower extremity excluding thigh, initial encounter    ED Discharge Orders          Ordered    amoxicillin -clavulanate (AUGMENTIN ) 875-125 MG tablet  2 times daily        08/02/24 2203               Tara Wich C, PA-C 08/02/24 2207    Bradler, Evan K, MD 08/02/24 2257

## 2024-08-02 NOTE — Discharge Instructions (Addendum)
 Please follow-up with occupational health in 7 to 10 days for recheck.  You may return to work tomorrow full duty no restrictions.  Please keep compressive dressing over the wound for 24 hours.  Return to the ER for any continued bleeding, drainage, swelling, worsening symptoms or any urgent changes in your health.

## 2024-08-09 ENCOUNTER — Encounter: Payer: Self-pay | Admitting: Nurse Practitioner

## 2024-08-12 MED ORDER — CLONAZEPAM 0.5 MG PO TABS: 0.5000 mg | ORAL_TABLET | Freq: Two times a day (BID) | ORAL | 0 refills | Status: AC | PRN

## 2024-08-12 MED ORDER — BUPROPION HCL ER (XL) 150 MG PO TB24: 150.0000 mg | ORAL_TABLET | Freq: Every day | ORAL | 0 refills | Status: AC

## 2024-08-20 ENCOUNTER — Other Ambulatory Visit: Payer: Self-pay | Admitting: Gastroenterology

## 2024-10-22 ENCOUNTER — Ambulatory Visit: Admitting: Nurse Practitioner
